# Patient Record
Sex: Male | Born: 1980 | Race: White | Hispanic: No | Marital: Married | Smoking: Current every day smoker
Health system: Southern US, Community
[De-identification: ages and names within clinical notes are randomized; demographics above are authoritative.]

## PROBLEM LIST (undated history)

## (undated) DIAGNOSIS — I4891 Unspecified atrial fibrillation: Secondary | ICD-10-CM

## (undated) DIAGNOSIS — G5760 Lesion of plantar nerve, unspecified lower limb: Secondary | ICD-10-CM

## (undated) HISTORY — PX: ANKLE ARTHROSCOPY: SUR85

## (undated) HISTORY — DX: Lesion of plantar nerve, unspecified lower limb: G57.60

---

## 2002-04-11 ENCOUNTER — Ambulatory Visit: Admission: RE | Admit: 2002-04-11 | Discharge: 2002-04-11 | Payer: Self-pay | Admitting: Orthopedic Surgery

## 2002-04-11 ENCOUNTER — Encounter: Payer: Self-pay | Admitting: Orthopedic Surgery

## 2002-08-27 ENCOUNTER — Emergency Department (HOSPITAL_COMMUNITY): Admission: AC | Admit: 2002-08-27 | Discharge: 2002-08-27 | Payer: Self-pay

## 2002-08-27 ENCOUNTER — Encounter: Payer: Self-pay | Admitting: *Deleted

## 2005-04-11 ENCOUNTER — Ambulatory Visit (HOSPITAL_COMMUNITY): Admission: RE | Admit: 2005-04-11 | Discharge: 2005-04-11 | Payer: Self-pay | Admitting: Orthopedic Surgery

## 2007-02-12 ENCOUNTER — Emergency Department (HOSPITAL_COMMUNITY): Admission: EM | Admit: 2007-02-12 | Discharge: 2007-02-12 | Payer: Self-pay | Admitting: Emergency Medicine

## 2011-01-14 LAB — CBC
HCT: 45.6
Hemoglobin: 16
MCHC: 35.1
MCV: 80.5
Platelets: 159
RBC: 5.67
RDW: 13.5
WBC: 7.5

## 2011-01-14 LAB — URINALYSIS, ROUTINE W REFLEX MICROSCOPIC
Glucose, UA: NEGATIVE
Hgb urine dipstick: NEGATIVE
Nitrite: NEGATIVE
Protein, ur: NEGATIVE
Specific Gravity, Urine: 1.04 — ABNORMAL HIGH
Urobilinogen, UA: 0.2
pH: 5.5

## 2011-01-14 LAB — COMPREHENSIVE METABOLIC PANEL
ALT: 28
AST: 24
Albumin: 4.2
Alkaline Phosphatase: 71
Chloride: 103
GFR calc Af Amer: >60
Potassium: 3.8
Total Bilirubin: 1.1

## 2011-01-14 LAB — DIFFERENTIAL
Basophils Absolute: 0
Eosinophils Absolute: 0
Lymphs Abs: 0.3 — ABNORMAL LOW
Neutro Abs: 6.8

## 2012-12-22 ENCOUNTER — Encounter: Payer: Self-pay | Admitting: *Deleted

## 2012-12-22 DIAGNOSIS — G5760 Lesion of plantar nerve, unspecified lower limb: Secondary | ICD-10-CM | POA: Insufficient documentation

## 2013-01-17 ENCOUNTER — Encounter: Payer: Self-pay | Admitting: Podiatry

## 2013-03-29 ENCOUNTER — Other Ambulatory Visit: Payer: Self-pay | Admitting: *Deleted

## 2013-04-11 ENCOUNTER — Encounter: Payer: Self-pay | Admitting: Podiatry

## 2013-04-14 ENCOUNTER — Ambulatory Visit: Payer: BC Managed Care – PPO | Admitting: Podiatry

## 2013-04-26 ENCOUNTER — Telehealth: Payer: Self-pay | Admitting: *Deleted

## 2013-04-26 NOTE — Telephone Encounter (Signed)
Left message stating I would be happy to help him reschedule his surgery.

## 2013-04-27 ENCOUNTER — Telehealth: Payer: Self-pay | Admitting: *Deleted

## 2013-04-27 NOTE — Telephone Encounter (Signed)
Pt request reschedule 05/03/2013.  I rescheduled pt to 05/17/2013.

## 2013-05-17 ENCOUNTER — Encounter: Payer: Self-pay | Admitting: Podiatry

## 2013-05-17 DIAGNOSIS — G5760 Lesion of plantar nerve, unspecified lower limb: Secondary | ICD-10-CM

## 2013-05-18 ENCOUNTER — Telehealth: Payer: Self-pay | Admitting: *Deleted

## 2013-05-18 NOTE — Telephone Encounter (Addendum)
Pt states has information from his insurance to fax confirmation he had surgery on 05/17/2013.  Pt's voicemail did not accept messages.  I called pt and he stated the confirmation had been sent by a receptionist in our office.

## 2013-05-19 ENCOUNTER — Encounter: Payer: Self-pay | Admitting: Podiatry

## 2013-05-23 ENCOUNTER — Ambulatory Visit (INDEPENDENT_AMBULATORY_CARE_PROVIDER_SITE_OTHER): Payer: BC Managed Care – PPO | Admitting: Podiatry

## 2013-05-23 VITALS — BP 143/73 | HR 53 | Resp 16 | Ht 73.0 in | Wt 205.0 lb

## 2013-05-23 DIAGNOSIS — G5760 Lesion of plantar nerve, unspecified lower limb: Secondary | ICD-10-CM

## 2013-05-23 NOTE — Progress Notes (Signed)
Subjective:     Patient ID: David Swanson, male   DOB: November 25, 1980, 33 y.o.   MRN: [id number]  HPI patient states I'm doing fine on my left foot and able to bear weight but it sore. 6 days after foot surgery   Review of Systems     Objective:   Physical Exam Neurovascular status intact with negative Homans sign noted and well-healing surgical site third interspace of the left foot    Assessment:     Neuroma surgery left it's healing very well    Plan:     Applied compression advised on continued immobilization for the next several weeks with gradual increase in activity. Reappoint any issues should occur if not we'll see back in next 6 weeks

## 2013-05-23 NOTE — Progress Notes (Signed)
Pt states doing well.  Pt's left foot had no swelling or redness, or drainage.

## 2013-05-24 NOTE — Progress Notes (Signed)
1) Neurectomy 3rd interspace left foot

## 2013-05-27 ENCOUNTER — Telehealth: Payer: Self-pay | Admitting: *Deleted

## 2013-05-27 NOTE — Telephone Encounter (Signed)
Pt states having the same nerve pain he had before the surgery.  I asked pt what he has been doing different that he did not do right after post-op.  Pt states he's walking without the post-op shoe, and up on it a lot more.  I told pt to go back into the post-op shoe, decrease his activity and ice periodically, and call for appt if red, swelling.  Pt denies any redness or swelling, states it looks better everyday.

## 2013-06-10 ENCOUNTER — Encounter: Payer: Self-pay | Admitting: Podiatry

## 2013-06-15 ENCOUNTER — Ambulatory Visit (INDEPENDENT_AMBULATORY_CARE_PROVIDER_SITE_OTHER): Payer: BC Managed Care – PPO | Admitting: Podiatry

## 2013-06-15 ENCOUNTER — Encounter: Payer: Self-pay | Admitting: Podiatry

## 2013-06-15 ENCOUNTER — Ambulatory Visit (INDEPENDENT_AMBULATORY_CARE_PROVIDER_SITE_OTHER): Payer: BC Managed Care – PPO

## 2013-06-15 VITALS — BP 152/77 | HR 64 | Resp 12

## 2013-06-15 DIAGNOSIS — M79606 Pain in leg, unspecified: Secondary | ICD-10-CM

## 2013-06-15 DIAGNOSIS — M79609 Pain in unspecified limb: Secondary | ICD-10-CM

## 2013-06-15 DIAGNOSIS — M779 Enthesopathy, unspecified: Secondary | ICD-10-CM

## 2013-06-15 MED ORDER — DICLOFENAC SODIUM 75 MG PO TBEC: 75.0000 mg | DELAYED_RELEASE_TABLET | Freq: Two times a day (BID) | ORAL | Status: DC

## 2013-06-15 NOTE — Progress Notes (Signed)
Subjective:     Patient ID: David AluJoshua D Bara, male   DOB: [birth year], 33 y.o.   MRN: [id number]  HPI patient presents stating I been getting pain in my forefoot left with some swelling and I was concerned about this   Review of Systems     Objective:   Physical Exam Neurovascular status intact negative Homans sign was noted and patient is well oriented x3. Patient has mild edema and discomfort around the lesser MPJs of the left foot    Assessment:     Probable inflammatory capsulitis of the MPJs left foot secondary to surgery with incision site itself healing very well with no erythema edema drainage and wound edges well coapted    Plan:     Instructed on compression elevation and today placed on Voltaren 75 mg twice a day. Reappoint as needed

## 2013-09-22 ENCOUNTER — Encounter: Payer: Self-pay | Admitting: Podiatry

## 2013-09-22 ENCOUNTER — Ambulatory Visit (INDEPENDENT_AMBULATORY_CARE_PROVIDER_SITE_OTHER): Payer: BC Managed Care – PPO

## 2013-09-22 ENCOUNTER — Ambulatory Visit (INDEPENDENT_AMBULATORY_CARE_PROVIDER_SITE_OTHER): Payer: BC Managed Care – PPO | Admitting: Podiatry

## 2013-09-22 VITALS — BP 121/66 | HR 60 | Resp 16

## 2013-09-22 DIAGNOSIS — G5760 Lesion of plantar nerve, unspecified lower limb: Secondary | ICD-10-CM

## 2013-09-22 DIAGNOSIS — M779 Enthesopathy, unspecified: Secondary | ICD-10-CM

## 2013-09-22 MED ORDER — TRIAMCINOLONE ACETONIDE 10 MG/ML IJ SUSP: 10.0000 mg | Freq: Once | INTRAMUSCULAR | Status: AC

## 2013-09-23 NOTE — Progress Notes (Signed)
Subjective:     Patient ID: David Swanson, male   DOB: 1980-09-01, 33 y.o.   MRN: [id number]  HPI patient presents stating he feels like maybe the nerve pain is coming back on his left third interspace about 4 months after having neuroma surgery   Review of Systems     Objective:   Physical Exam Neurovascular status is intact with well-healed surgical site third interspace left with mild symptoms when I pressed deep into the tissue but does not appear to be specifically neuroma relayed    Assessment:     Would be very unusual to have this corrected return of neuroma-like symptoms and it may be some form of inflammatory condition    Plan:     Due to steroidal injection into the third interspace 3 mm Kenalog 5 of Xylocaine Marcaine mixture and reviewed x-rays and reappoint again to reevaluate in the next few weeks. It is possible we may need to go back into this area and exploratory fashion

## 2013-10-27 ENCOUNTER — Ambulatory Visit (INDEPENDENT_AMBULATORY_CARE_PROVIDER_SITE_OTHER): Payer: BC Managed Care – PPO | Admitting: Podiatry

## 2013-10-27 ENCOUNTER — Encounter: Payer: Self-pay | Admitting: Podiatry

## 2013-10-27 DIAGNOSIS — G5762 Lesion of plantar nerve, left lower limb: Secondary | ICD-10-CM

## 2013-10-27 DIAGNOSIS — G5760 Lesion of plantar nerve, unspecified lower limb: Secondary | ICD-10-CM

## 2013-10-27 DIAGNOSIS — M779 Enthesopathy, unspecified: Secondary | ICD-10-CM

## 2013-10-27 MED ORDER — DICLOFENAC SODIUM 75 MG PO TBEC: 75.0000 mg | DELAYED_RELEASE_TABLET | Freq: Two times a day (BID) | ORAL | Status: DC

## 2013-10-29 NOTE — Progress Notes (Signed)
Subjective:     Patient ID: David Swanson, male   DOB: 1980-11-01, 33 y.o.   MRN: [id number]  HPI patient states that it's gradually improving but he still feels like there is a lump under his left foot   Review of Systems     Objective:   Physical Exam Neurovascular status intact with the third interspace which seems to be somewhat better but more with discomfort under the second metatarsal and possibly third metatarsal    Assessment:     May be some low-grade inflammatory capsulitis versus any kind of residual neuroma pathology    Plan:     Begin anti-inflammatory at the current time and continue his senior and let's give it 2 months and decide whether cortisone may be appropriate for the underlying joint pathology or if it just gets better on exam

## 2013-12-29 ENCOUNTER — Ambulatory Visit: Payer: BC Managed Care – PPO | Admitting: Podiatry

## 2014-02-06 ENCOUNTER — Ambulatory Visit: Payer: BC Managed Care – PPO | Admitting: Podiatry

## 2014-04-22 ENCOUNTER — Emergency Department (HOSPITAL_COMMUNITY)
Admission: EM | Admit: 2014-04-22 | Discharge: 2014-04-22 | Disposition: A | Discharge status: Home / Self Care | Payer: BLUE CROSS/BLUE SHIELD | Attending: Emergency Medicine | Admitting: Emergency Medicine

## 2014-04-22 ENCOUNTER — Encounter (HOSPITAL_COMMUNITY): Payer: Self-pay | Admitting: *Deleted

## 2014-04-22 DIAGNOSIS — Z720 Tobacco use: Secondary | ICD-10-CM | POA: Diagnosis not present

## 2014-04-22 DIAGNOSIS — Y288XXA Contact with other sharp object, undetermined intent, initial encounter: Secondary | ICD-10-CM | POA: Diagnosis not present

## 2014-04-22 DIAGNOSIS — Z23 Encounter for immunization: Secondary | ICD-10-CM | POA: Diagnosis not present

## 2014-04-22 DIAGNOSIS — Y998 Other external cause status: Secondary | ICD-10-CM | POA: Diagnosis not present

## 2014-04-22 DIAGNOSIS — Y9289 Other specified places as the place of occurrence of the external cause: Secondary | ICD-10-CM | POA: Insufficient documentation

## 2014-04-22 DIAGNOSIS — Y9389 Activity, other specified: Secondary | ICD-10-CM | POA: Diagnosis not present

## 2014-04-22 DIAGNOSIS — S61511A Laceration without foreign body of right wrist, initial encounter: Secondary | ICD-10-CM | POA: Diagnosis present

## 2014-04-22 DIAGNOSIS — Z791 Long term (current) use of non-steroidal anti-inflammatories (NSAID): Secondary | ICD-10-CM | POA: Insufficient documentation

## 2014-04-22 MED ORDER — LIDOCAINE-EPINEPHRINE-TETRACAINE (LET) SOLUTION
NASAL | Status: AC
  Filled 2014-04-22: qty 3

## 2014-04-22 MED ORDER — LIDOCAINE-EPINEPHRINE-TETRACAINE (LET) SOLUTION
3.0000 mL | Freq: Once | NASAL | Status: AC
  Administered 2014-04-22: 3 mL via TOPICAL

## 2014-04-22 MED ORDER — TETANUS-DIPHTH-ACELL PERTUSSIS 5-2.5-18.5 LF-MCG/0.5 IM SUSP
0.5000 mL | Freq: Once | INTRAMUSCULAR | Status: AC
  Administered 2014-04-22: 0.5 mL via INTRAMUSCULAR

## 2014-04-22 MED ORDER — POVIDONE-IODINE 10 % EX SOLN
CUTANEOUS | Status: AC
  Administered 2014-04-22: 19:00:00
  Filled 2014-04-22: qty 118

## 2014-04-22 MED ORDER — TETANUS-DIPHTH-ACELL PERTUSSIS 5-2.5-18.5 LF-MCG/0.5 IM SUSP
INTRAMUSCULAR | Status: AC
  Filled 2014-04-22: qty 0.5

## 2014-04-22 MED ORDER — LIDOCAINE HCL (PF) 1 % IJ SOLN
INTRAMUSCULAR | Status: AC
  Administered 2014-04-22: 19:00:00
  Filled 2014-04-22: qty 5

## 2014-04-22 NOTE — ED Notes (Signed)
Laceration to right wrist

## 2014-04-22 NOTE — ED Provider Notes (Signed)
CSN: [id number]     Arrival date & time 04/22/14  1701 History   First MD Initiated Contact with Patient 04/22/14 1749     Chief Complaint  Patient presents with  . Extremity Laceration     (Consider location/radiation/quality/duration/timing/severity/associated sxs/prior Treatment) Patient is a 34 y.o. male presenting with skin laceration. The history is provided by the patient.  Laceration Location:  Hand Hand laceration location:  R wrist Length (cm):  2 cm Depth:  Through underlying tissue Quality: straight   Bleeding: controlled   Time since incident:  1 hour Laceration mechanism:  Razor Pain details:    Quality:  Burning   Severity:  Mild   Timing:  Constant Foreign body present:  No foreign bodies Relieved by:  Pressure Worsened by:  Nothing tried Ineffective treatments:  None tried Tetanus status:  Out of date  David Swanson is a 34 y.o. male who presents to the ED with a laceration to the right wrist. He states he was using a razor knife and it slipped and cut him. He denies any other injuries or problems.  Past Medical History  Diagnosis Date  . 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 Ankle arthroscopy Bilateral UNKNOWN    TYPE OF SURGERY B/L UNKNOWN   Family History  Problem Relation Age of Onset  . Cancer Mother    History  Substance Use Topics  . Smoking status: Current Every Day Smoker -- 0.25 packs/day for 5 years    Start date: 12/23/2007  . Smokeless tobacco: Not on file  . Alcohol Use: No    Review of Systems Negative except as stated in HPI   Allergies  Review of patient's allergies indicates no known allergies.  Home Medications   Prior to Admission medications   Medication Sig Start Date End Date Taking? Authorizing Provider  diclofenac (VOLTAREN) 75 MG EC tablet Take 1 tablet (75 mg total) by mouth 2 (two) times daily. 10/27/13   Kirstie Peri Regal, DPM   BP 136/79 mmHg   Pulse 54  Temp(Src) 98.4 F (36.9 C) (Oral)  Resp 18  Ht  (1.88 m)  Wt 190 lb (86.183 kg)  BMI 24.38 kg/m2  SpO2 99% Physical Exam  Constitutional: He is oriented to person, place, and time. He appears well-developed and well-nourished.  HENT:  Head: Normocephalic.  Eyes: EOM are normal.  Neck: Neck supple.  Cardiovascular: Normal rate.   Pulmonary/Chest: Effort normal.  Musculoskeletal: Normal range of motion.       Right wrist: He exhibits tenderness and laceration. He exhibits normal range of motion, no swelling and no deformity.  Normal strength and sensation.   Neurological: He is alert and oriented to person, place, and time. No cranial nerve deficit.  Skin: Skin is warm and dry.  Psychiatric: He has a normal mood and affect. His behavior is normal.  Nursing note and vitals reviewed.   ED Course  Procedures  LACERATION REPAIR Performed by: NEESE,HOPE Authorized by: NEESE,HOPE Consent: Verbal consent obtained. Risks and benefits: risks, benefits and alternatives were discussed Consent given by: patient Patient identity confirmed: provided demographic data Prepped and Draped in normal sterile fashion Wound explored not foreign body identified LET applied  Laceration Location: right wrist  Laceration Length: 2 cm  No Foreign Bodies seen or palpated  Anesthesia: local infiltration  Local anesthetic: lidocaine 1% without epinephrine  Anesthetic total: 1 ml  Irrigation method:  syringe Amount of cleaning: standard  Skin closure: 4-0 prolene  Number of sutures: 4  Technique: interrupted  Patient tolerance: Patient tolerated the procedure well with no immediate complications.  MDM  34 y.o. male with laceration to the right wrist. Stable for d/c without neurovascular deficits.   Tetanus updated Final diagnoses:  Laceration of right wrist without complication, initial encounter     [facility] Neese, NP 04/22/14 1830  Gerhard Munchobert Lockwood, MD 04/22/14  2101

## 2014-04-22 NOTE — ED Notes (Signed)
Medications given by Mayer CamelH. Neese, EDNP.

## 2014-07-03 ENCOUNTER — Ambulatory Visit (INDEPENDENT_AMBULATORY_CARE_PROVIDER_SITE_OTHER): Payer: BLUE CROSS/BLUE SHIELD

## 2014-07-03 ENCOUNTER — Ambulatory Visit (INDEPENDENT_AMBULATORY_CARE_PROVIDER_SITE_OTHER): Payer: BLUE CROSS/BLUE SHIELD | Admitting: Podiatry

## 2014-07-03 VITALS — BP 122/78 | HR 63 | Resp 16

## 2014-07-03 DIAGNOSIS — M79672 Pain in left foot: Secondary | ICD-10-CM

## 2014-07-03 DIAGNOSIS — G5762 Lesion of plantar nerve, left lower limb: Secondary | ICD-10-CM

## 2014-07-03 NOTE — Patient Instructions (Signed)
Pre-Operative Instructions  Congratulations, you have decided to take an important step to improving your quality of life.  You can be assured that the doctors of [facility] will be with you every step of the way.  1. Plan to be at the surgery center/hospital at least 1 (one) hour prior to your scheduled time unless otherwise directed by the surgical center/hospital staff.  You must have a responsible adult accompany you, remain during the surgery and drive you home.  Make sure you have directions to the surgical center/hospital and know how to get there on time. 2. For hospital based surgery you will need to obtain a history and physical form from your family physician within 1 month prior to the date of surgery- we will give you a form for you primary physician.  3. We make every effort to accommodate the date you request for surgery.  There are however, times where surgery dates or times have to be moved.  We will contact you as soon as possible if a change in schedule is required.   4. No Aspirin/Ibuprofen for one week before surgery.  If you are on aspirin, any non-steroidal anti-inflammatory medications (Mobic, Aleve, Ibuprofen) you should stop taking it 7 days prior to your surgery.  You make take Tylenol  For pain prior to surgery.  5. Medications- If you are taking daily heart and blood pressure medications, seizure, reflux, allergy, asthma, anxiety, pain or diabetes medications, make sure the surgery center/hospital is aware before the day of surgery so they may notify you which medications to take or avoid the day of surgery. 6. No food or drink after midnight the night before surgery unless directed otherwise by surgical center/hospital staff. 7. No alcoholic beverages 24 hours prior to surgery.  No smoking 24 hours prior to or 24 hours after surgery. 8. Wear loose pants or shorts- loose enough to fit over bandages, boots, and casts. 9. No slip on shoes, sneakers are best. 10. Bring  your boot with you to the surgery center/hospital.  Also bring crutches or a walker if your physician has prescribed it for you.  If you do not have this equipment, it will be provided for you after surgery. 11. If you have not been contracted by the surgery center/hospital by the day before your surgery, call to confirm the date and time of your surgery. 12. Leave-time from work may vary depending on the type of surgery you have.  Appropriate arrangements should be made prior to surgery with your employer. 13. Prescriptions will be provided immediately following surgery by your doctor.  Have these filled as soon as possible after surgery and take the medication as directed. 14. Remove nail polish on the operative foot. 15. Wash the night before surgery.  The night before surgery wash the foot and leg well with the antibacterial soap provided and water paying special attention to beneath the toenails and in between the toes.  Rinse thoroughly with water and dry well with a towel.  Perform this wash unless told not to do so by your physician.  Enclosed: 1 Ice pack (please put in freezer the night before surgery)   1 Hibiclens skin cleaner   Pre-op Instructions  If you have any questions regarding the instructions, do not hesitate to call our office.  [place]: [address] [place], [place] [address] [phone]  [place]: [address] [place], [place] [address] [phone]  [place]: [address]  , [place] [address] [phone]  Dr. Richard   Tuchman DPM, Dr. Norman Regal DPM Dr. Richard Sikora DPM, Dr. M. Todd Hyatt DPM, Dr. Kathryn Egerton DPM 

## 2014-07-04 NOTE — Progress Notes (Signed)
Subjective:     Patient ID: David Swanson, male   DOB: Dec 26, 1980, 34 y.o.   MRN: [id number]  HPI patient states I'm still having a lot of problems with this left foot. States that it seemed to improve and now does bother me again and I'm feeling sharp burning pain but it's more under my foot that on top. I do feel numbness between my third and fourth toes   Review of Systems     Objective:   Physical Exam Neurovascular status intact with muscle strength adequate and range of motion subtalar and midtarsal joint within normal limits. Patient's noted to have pain that palpable on the plantar aspect of the surface of the third interspace and slightly around the third metatarsal. It creates an area of sharp pain but what I did the typical Mulder's test I was unable to elicit discomfort    Assessment:     Probability for some kind of a aberrant neuroma or nerve on the plantar aspect of the foot that is creating a lot of pain with reduction of symptoms secondary to traditional neuroma excision    Plan:     Explained this all to patient at this point I recommended a plantar approach with exploration of the area and probable removal of nerve tissue with the chance that there is no guarantee that this will get this better. He understands all this wants surgery and at this time I allowed him to read a consent form reviewing all risk associated with procedure and alternative treatments. He wants surgery and signs consent form and I did dispense air fracture walker with instructions on usage in order to keep pressure off the plantar incision postoperatively and that total recovery. We'll hopefully take 6-8 weeks and may have some discomfort for 6 months to one year

## 2014-07-17 ENCOUNTER — Telehealth: Payer: Self-pay | Admitting: *Deleted

## 2014-07-17 NOTE — Telephone Encounter (Signed)
I called patient to inquire about insurance.  Trying to check benefits for surgery.  [facility]."  Okay, thank you.

## 2014-07-21 ENCOUNTER — Telehealth: Payer: Self-pay | Admitting: *Deleted

## 2014-07-21 NOTE — Telephone Encounter (Signed)
Patients new post-op appointment is Monday 08/14/2014 @ 3:45pm.

## 2014-07-21 NOTE — Telephone Encounter (Signed)
"  I need to postpone my surgery.  It's scheduled for next week with Dr. Charlsie Merlesegal.  I have a project on my job that I have to complete."  I can get you rescheduled to 08/08/2014.  "That will be fine.  Will it be the same time?"  No, surgical center will call you with arrival time a few days prior to date.  "Okay, thanks."  I called and left Renee a message to reschedule his surgery from 07/25/2014 to 08/08/2014.  I sent Aram BeechamCynthia a fax.

## 2014-07-31 ENCOUNTER — Other Ambulatory Visit: Payer: BLUE CROSS/BLUE SHIELD

## 2014-08-08 ENCOUNTER — Encounter: Payer: Self-pay | Admitting: Podiatry

## 2014-08-08 DIAGNOSIS — G5760 Lesion of plantar nerve, unspecified lower limb: Secondary | ICD-10-CM | POA: Diagnosis not present

## 2014-08-14 ENCOUNTER — Encounter: Payer: Self-pay | Admitting: Podiatry

## 2014-08-14 ENCOUNTER — Ambulatory Visit (INDEPENDENT_AMBULATORY_CARE_PROVIDER_SITE_OTHER): Payer: BLUE CROSS/BLUE SHIELD | Admitting: Podiatry

## 2014-08-14 VITALS — BP 123/73 | HR 67 | Resp 18

## 2014-08-14 DIAGNOSIS — G5762 Lesion of plantar nerve, left lower limb: Secondary | ICD-10-CM

## 2014-08-14 DIAGNOSIS — Z9889 Other specified postprocedural states: Secondary | ICD-10-CM

## 2014-08-14 NOTE — Patient Instructions (Signed)
Monitor for any signs/symptoms of infection. Call the office immediately if any occur or go directly to the emergency room. Call with any questions/concerns.  

## 2014-08-15 ENCOUNTER — Encounter: Payer: Self-pay | Admitting: Podiatry

## 2014-08-15 NOTE — Progress Notes (Signed)
Patient ID: David Swanson, male   DOB: 07-18-1980, 34 y.o.   MRN: [id number]  Subjective: 34 year old male presents the office today one-week status post left foot third interspace revisional neurectomy. He states that overall he is doing well and he has had no pain to the surgical site. Has continued wearing the CAM walker without any difficulties.  He denies any systemic complaints such as fevers, chills, nausea, vomiting. Denies any calf pain, chest pain, soreness of breath. No other complaints at this time in no acute changes since last appointment.  Objective: AAO x3, NAD DP/PT pulses palpable bilaterally, CRT less than 3 seconds Protective sensation intact with Simms Weinstein monofilament  Incisional the plantar aspect of the third interspaces well coapted. Upon my evaluation the sutures have been removed. The incision remains well coapted. There is no  Surrounding erythema, ascending cellulitis, drainage, malodor, or any other clinical signs of infection. There is trace edema around the surgical site. There is no tenderness to palpation overlying the area. No other areas of tenderness to bilateral lower extremities. No open lesions or pre-ulcerative lesions.  No overlying edema, erythema, increase in warmth to bilateral lower extremities.  No pain with calf compression, swelling, warmth, erythema bilaterally.   Assessment:  34 year old male 1 week status post left third interspace neurectomy,  revisional   Plan: -Treatment options were discussed the patient with alternatives, risks, complications. -Upon my evaluation the sutures had already been removed. Therefore, Steri-Strips were applied followed by antibiotic ointment and a bandage. I discussed the patient that if the incision remains coapted there is no prominence of the incision he can start to shower in 24-48 hours. If there is any problems with the incision to hold off on showering and call the office immediately. -Continue with  Cam Walker. -Ice and elevation -Pain medication as needed -Monitoring clinical signs or symptoms of infection and her DVT/PE and directed to call the office immediately should any occur or go to the ER. -Follow-up in 1 week or sooner if any problems are to arise. In the meantime call the office with any questions, concerns, change in symptoms.

## 2014-08-15 NOTE — Progress Notes (Signed)
DOS 08/08/2014 excision soft tissue mass, plantar approach left foot

## 2014-08-21 ENCOUNTER — Encounter: Payer: Self-pay | Admitting: Podiatry

## 2014-08-21 ENCOUNTER — Ambulatory Visit (INDEPENDENT_AMBULATORY_CARE_PROVIDER_SITE_OTHER): Payer: BLUE CROSS/BLUE SHIELD | Admitting: Podiatry

## 2014-08-21 VITALS — BP 114/77 | HR 85 | Resp 18

## 2014-08-21 DIAGNOSIS — Z9889 Other specified postprocedural states: Secondary | ICD-10-CM

## 2014-08-22 NOTE — Progress Notes (Signed)
Subjective:     Patient ID: David AluJoshua D Swanson, male   DOB: 1981/02/03, 34 y.o.   MRN: [id number]  HPI patient presents stating it feels fine and I'm walking good   Review of Systems     Objective:   Physical Exam Neurovascular status intact with well healed surgical site plantar left third interspace with wound edges that are healing well with no gapping at the current time. He states that it feels better when palpated    Assessment:     Doing well post plantar neurectomy third interspace left    Plan:     Applied Steri-Strips to the area and compression and advised on wearing his boot for approximately one more week. Patient will reappoint if any Sherral HammersRobbins should occur

## 2014-10-05 ENCOUNTER — Encounter: Payer: Self-pay | Admitting: Podiatry

## 2014-10-05 ENCOUNTER — Ambulatory Visit (INDEPENDENT_AMBULATORY_CARE_PROVIDER_SITE_OTHER): Payer: BLUE CROSS/BLUE SHIELD | Admitting: Podiatry

## 2014-10-05 DIAGNOSIS — B079 Viral wart, unspecified: Secondary | ICD-10-CM | POA: Diagnosis not present

## 2014-10-05 DIAGNOSIS — B078 Other viral warts: Secondary | ICD-10-CM

## 2014-10-05 DIAGNOSIS — G5762 Lesion of plantar nerve, left lower limb: Secondary | ICD-10-CM

## 2014-10-05 NOTE — Progress Notes (Signed)
Subjective:     Patient ID: David AluJoshua D Swanson, male   DOB: 27-Jan-1981, 34 y.o.   MRN: [id number]  HPI patient states I'm doing well on my left foot but have one area of keratotic tissue and also I like to wait a little longer to go back to work   Review of Systems     Objective:   Physical Exam Her vascular status intact with distal keratotic lesion on the plantar incision site localized in nature with no proximal medial or lateral spread    Assessment:     Lesion secondary to pressure with possibility for verruca component    Plan:     Debrided the lesion noted a small amount of pinpoint bleeding and applied chemical agent to try to kill any virus cell and applied sterile dressing

## 2014-10-20 ENCOUNTER — Emergency Department (INDEPENDENT_AMBULATORY_CARE_PROVIDER_SITE_OTHER)
Admission: EM | Admit: 2014-10-20 | Discharge: 2014-10-20 | Disposition: A | Discharge status: Home / Self Care | Payer: BLUE CROSS/BLUE SHIELD | Source: Home / Self Care | Attending: Family Medicine | Admitting: Family Medicine

## 2014-10-20 ENCOUNTER — Encounter (HOSPITAL_COMMUNITY): Payer: Self-pay | Admitting: Emergency Medicine

## 2014-10-20 DIAGNOSIS — J029 Acute pharyngitis, unspecified: Secondary | ICD-10-CM | POA: Diagnosis not present

## 2014-10-20 LAB — POCT RAPID STREP A: STREPTOCOCCUS, GROUP A SCREEN (DIRECT): NEGATIVE

## 2014-10-20 MED ORDER — AMOXICILLIN 500 MG PO CAPS: 500.0000 mg | ORAL_CAPSULE | Freq: Two times a day (BID) | ORAL | Status: DC

## 2014-10-20 NOTE — ED Provider Notes (Signed)
CSN: [id number]     Arrival date & time 10/20/14  1407 History   First MD Initiated Contact with Patient 10/20/14 1421     Chief Complaint  Patient presents with  . Sore Throat   (Consider location/radiation/quality/duration/timing/severity/associated sxs/prior Treatment) HPI  Sore throat. Started 1 day ago. Associated with body aches, fevers, chills, difficulty swallowing. Patient is not taken any medications for this. Symptoms are constant and getting worse. Denies abdominal pain, neck stiffness, chest pain, shortness breath, palpitations. Of note patient with diagnosis of flu and subsequent pneumonia back in March.    Past Medical History  Diagnosis Date  . Gwenevere AbbotMorton neuroma [id number]    LEFT 3 RD INTERSPACE   Past Surgical History  Procedure Laterality Date  . Ankle arthroscopy Bilateral UNKNOWN    TYPE OF SURGERY B/L UNKNOWN   Family History  Problem Relation Age of Onset  . Cancer Mother    History  Substance Use Topics  . Smoking status: Current Every Day Smoker -- 0.25 packs/day for 5 years    Start date: 12/23/2007  . Smokeless tobacco: Not on file  . Alcohol Use: No    Review of Systems Per HPI with all other pertinent systems negative.   Allergies  Review of patient's allergies indicates no known allergies.  Home Medications   Prior to Admission medications   Medication Sig Start Date End Date Taking? Authorizing Provider  amoxicillin (AMOXIL) 500 MG capsule Take 1 capsule (500 mg total) by mouth 2 (two) times daily. 10/20/14   Ozella Rocksavid J Merrell, MD  diclofenac (VOLTAREN) 75 MG EC tablet Take 1 tablet (75 mg total) by mouth 2 (two) times daily. 10/27/13   Kirstie PeriNorman S Regal, DPM   BP 127/75 mmHg  Pulse 74  Temp(Src) 101.5 F (38.6 C) (Oral)  Resp 16  SpO2 95% Physical Exam Physical Exam  Constitutional: oriented to person, place, and time. appears well-developed and well-nourished. No distress.  HENT:  Tonsils 1+ with exudate and very erythematous. Head:  Normocephalic and atraumatic.  Eyes: EOMI. PERRL.  Neck: Normal range of motion.  Cardiovascular: RRR, no m/r/g, 2+ distal pulses,  Pulmonary/Chest: Effort normal and breath sounds normal. No respiratory distress.  Abdominal: Soft. Bowel sounds are normal. NonTTP, no distension.  Musculoskeletal: Normal range of motion. Non ttp, no effusion.  Neurological: alert and oriented to person, place, and time.  Skin: Skin is warm. No rash noted. non diaphoretic.  Psychiatric: normal mood and affect. behavior is normal. Judgment and thought content normal.   ED Course  Procedures (including critical care time) Labs Review Labs Reviewed  POCT RAPID STREP A    Imaging Review No results found.   MDM   1. Sore throat    Strep negative. We'll send strep culture. Start amoxicillin.    Ozella Rocksavid J Merrell, MD 10/20/14 [phone]

## 2014-10-20 NOTE — ED Notes (Signed)
C/o sore throat which started yesterday  States he has a fever, body aches No tx

## 2014-10-20 NOTE — Discharge Instructions (Signed)
You likely have strep throat. Please start the amoxicillin and take it for the full 10 days.

## 2014-10-22 LAB — CULTURE, GROUP A STREP: STREP A CULTURE: NEGATIVE

## 2015-12-22 ENCOUNTER — Encounter (HOSPITAL_BASED_OUTPATIENT_CLINIC_OR_DEPARTMENT_OTHER): Payer: Self-pay | Admitting: *Deleted

## 2015-12-22 ENCOUNTER — Emergency Department (HOSPITAL_BASED_OUTPATIENT_CLINIC_OR_DEPARTMENT_OTHER)
Admission: EM | Admit: 2015-12-22 | Discharge: 2015-12-22 | Disposition: A | Discharge status: Home / Self Care | Payer: BLUE CROSS/BLUE SHIELD | Attending: Emergency Medicine | Admitting: Emergency Medicine

## 2015-12-22 DIAGNOSIS — F17200 Nicotine dependence, unspecified, uncomplicated: Secondary | ICD-10-CM | POA: Insufficient documentation

## 2015-12-22 DIAGNOSIS — I4891 Unspecified atrial fibrillation: Secondary | ICD-10-CM | POA: Insufficient documentation

## 2015-12-22 DIAGNOSIS — Z7982 Long term (current) use of aspirin: Secondary | ICD-10-CM | POA: Diagnosis not present

## 2015-12-22 DIAGNOSIS — R002 Palpitations: Secondary | ICD-10-CM | POA: Diagnosis present

## 2015-12-22 LAB — COMPREHENSIVE METABOLIC PANEL
ALBUMIN: 4.6 g/dL (ref 3.5–5.0)
ALK PHOS: 45 U/L (ref 38–126)
ALT: 30 U/L (ref 17–63)
ANION GAP: 9 (ref 5–15)
AST: 26 U/L (ref 15–41)
BUN: 15 mg/dL (ref 6–20)
CALCIUM: 9.9 mg/dL (ref 8.9–10.3)
CHLORIDE: 104 mmol/L (ref 101–111)
CO2: 23 mmol/L (ref 22–32)
Creatinine, Ser: 0.91 mg/dL (ref 0.61–1.24)
GFR calc non Af Amer: >60 mL/min (ref >60)
GLUCOSE: 147 mg/dL — AB (ref 65–99)
Potassium: 4.1 mmol/L (ref 3.5–5.1)
SODIUM: 136 mmol/L (ref 135–145)
Total Bilirubin: 1.2 mg/dL (ref 0.3–1.2)
Total Protein: 7.5 g/dL (ref 6.5–8.1)

## 2015-12-22 LAB — CBC WITH DIFFERENTIAL/PLATELET
BASOS ABS: 0 10*3/uL (ref 0.0–0.1)
BASOS PCT: 0 %
EOS ABS: 0.1 10*3/uL (ref 0.0–0.7)
Eosinophils Relative: 1 %
HEMATOCRIT: 49.2 % (ref 39.0–52.0)
HEMOGLOBIN: 17.7 g/dL — AB (ref 13.0–17.0)
LYMPHS ABS: 2.6 10*3/uL (ref 0.7–4.0)
LYMPHS PCT: 24 %
MCH: 28.9 pg (ref 26.0–34.0)
MCHC: 36 g/dL (ref 30.0–36.0)
MCV: 80.3 fL (ref 78.0–100.0)
Monocytes Absolute: 1.4 10*3/uL — ABNORMAL HIGH (ref 0.1–1.0)
Monocytes Relative: 13 %
NEUTROS ABS: 6.7 10*3/uL (ref 1.7–7.7)
NEUTROS PCT: 62 %
Platelets: 210 10*3/uL (ref 150–400)
RBC: 6.13 MIL/uL — ABNORMAL HIGH (ref 4.22–5.81)
RDW: 14.3 % (ref 11.5–15.5)
WBC: 10.8 10*3/uL — AB (ref 4.0–10.5)

## 2015-12-22 LAB — D-DIMER, QUANTITATIVE: D-Dimer, Quant: <0.27 ug/mL-FEU (ref 0.00–0.50)

## 2015-12-22 LAB — TROPONIN I

## 2015-12-22 MED ORDER — ADENOSINE 6 MG/2ML IV SOLN
6.0000 mg | Freq: Once | INTRAVENOUS | Status: AC
  Administered 2015-12-22: 12 mg via INTRAVENOUS

## 2015-12-22 MED ORDER — SODIUM CHLORIDE 0.9 % IV BOLUS (SEPSIS)
1000.0000 mL | Freq: Once | INTRAVENOUS | Status: AC
  Administered 2015-12-22: 1000 mL via INTRAVENOUS

## 2015-12-22 MED ORDER — ETOMIDATE 2 MG/ML IV SOLN
0.1000 mg/kg | Freq: Once | INTRAVENOUS | Status: AC
  Administered 2015-12-22: 8.4 mg via INTRAVENOUS
  Filled 2015-12-22: qty 10

## 2015-12-22 MED ORDER — ADENOSINE 6 MG/2ML IV SOLN
12.0000 mg | Freq: Once | INTRAVENOUS | Status: AC
  Administered 2015-12-22: 12 mg via INTRAVENOUS

## 2015-12-22 MED ORDER — ADENOSINE 6 MG/2ML IV SOLN
INTRAVENOUS | Status: AC
  Filled 2015-12-22: qty 4

## 2015-12-22 MED ORDER — ETOMIDATE 2 MG/ML IV SOLN
INTRAVENOUS | Status: AC | PRN
  Administered 2015-12-22: 2.2 mg via INTRAVENOUS
  Administered 2015-12-22: 2.1 mg via INTRAVENOUS
  Administered 2015-12-22: 2.2 mg via INTRAVENOUS

## 2015-12-22 MED ORDER — METOPROLOL TARTRATE 25 MG PO TABS: 12.5000 mg | ORAL_TABLET | Freq: Two times a day (BID) | ORAL | 1 refills | Status: DC

## 2015-12-22 MED ORDER — ASPIRIN 325 MG PO TABS
325.0000 mg | ORAL_TABLET | Freq: Once | ORAL | Status: AC
  Administered 2015-12-22: 325 mg via ORAL
  Filled 2015-12-22: qty 1

## 2015-12-22 MED ORDER — ADENOSINE 6 MG/2ML IV SOLN
INTRAVENOUS | Status: AC
  Administered 2015-12-22: 6 mg
  Filled 2015-12-22: qty 6

## 2015-12-22 MED ORDER — ASPIRIN EC 325 MG PO TBEC: 325.0000 mg | DELAYED_RELEASE_TABLET | Freq: Every day | ORAL | 0 refills | Status: DC

## 2015-12-22 NOTE — ED Triage Notes (Signed)
Pt reports he feels as if his heart is racing/pounding sensation. Also reports associated shortness of breath and lightheadedness. States this began last night. Denies hx of dysrhythmias. Denies fever, n/v/d.

## 2015-12-22 NOTE — ED Notes (Signed)
MD at bedside. 

## 2015-12-22 NOTE — ED Provider Notes (Signed)
[facility] Provider Note   CSN: [id number] Arrival date & time: 12/22/15  1114     History   Chief Complaint Chief Complaint  Patient presents with  . Palpitations       HPI David Swanson is a 35 y.o.male presents for heart palpitations that started yesterday evening at approximately 11 pm He noted that he felt dehydrated throughout the day yesterday. He had been working at [employer] and had not been drinking well. He continued his usual activities throughout the day but noted his heart was beating fast after intercourse with his fiance at approximately 11 pm, and this sensation did not stop. He drank 4 glasses of water overnight as he was very thirsty but had no improvement in his heart rate.  He was able to sleep but when he awoke this AM he still felt his heart was "pounding" so he presented to the ED. He denies chest pain, but does feel he is having shortness of breath. He denies a cardiac history, denies a family history of cardiac disease. He has never had anything like this happen before. He denies drug use, but does smoke 0.5 ppd and drinks one glass of wine per day.  Otherwise he denies recent recent illness, nausea, emesis, diarrhea  Past Medical History:  Diagnosis Date  . 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 Morton neuroma     Past Surgical History:  Procedure Laterality Date  . ANKLE ARTHROSCOPY Bilateral UNKNOWN   TYPE OF SURGERY B/L UNKNOWN       Home Medications    Prior to Admission medications   Medication Sig Start Date End Date Taking? Authorizing Provider  amoxicillin (AMOXIL) 500 MG capsule Take 1 capsule (500 mg total) by mouth 2 (two) times daily. 10/20/14   Ozella Rocks, MD  aspirin EC 325 MG tablet Take 1 tablet (325 mg total) by mouth daily. 12/22/15   Bonney Aid, MD  diclofenac (VOLTAREN) 75 MG EC tablet Take 1 tablet (75 mg total) by mouth 2 (two) times  daily. 10/27/13   Lenn Sink, DPM  metoprolol tartrate (LOPRESSOR) 25 MG tablet Take 0.5 tablets (12.5 mg total) by mouth 2 (two) times daily. 12/22/15   Bonney Aid, MD    Family History Family History  Problem Relation Age of Onset  . Cancer Mother     Social History Social History  Substance Use Topics  . Smoking status: Current Every Day Smoker    Packs/day: 0.25    Years: 5.00    Start date: 12/23/2007  . Smokeless tobacco: Never Used  . Alcohol use 0.6 - 1.2 oz/week    1 - 2 Glasses of wine per week     Comment: daily     Allergies   Review of patient's allergies indicates no known allergies.   Review of Systems Review of Systems   Physical Exam Updated Vital Signs BP 126/81 (BP Location: Right Arm)   Pulse 60   Temp 98.2 F (36.8 C) (Oral)   Resp 18   Ht 6\' 2"  (1.88 m)   Wt 83.9 kg   SpO2 100%   BMI 23.75 kg/m   Physical Exam   ED Treatments / Results  Labs (all labs ordered are listed, but only abnormal results are displayed) Labs Reviewed  CBC WITH DIFFERENTIAL/PLATELET - Abnormal; Notable for the following:  Result Value   WBC 10.8 (*)    RBC 6.13 (*)    Hemoglobin 17.7 (*)    Monocytes Absolute 1.4 (*)    All other components within normal limits  COMPREHENSIVE METABOLIC PANEL - Abnormal; Notable for the following:    Glucose, Bld 147 (*)    All other components within normal limits  TROPONIN I  D-DIMER, QUANTITATIVE (NOT AT [facility])    EKG  EKG Interpretation  Date/Time:  Saturday December 22 2015 11:20:19 EDT Ventricular Rate:  161 PR Interval:    QRS Duration: 86 QT Interval:  284 QTC Calculation: 464 R Axis:   57 Text Interpretation:  Atrial fibrillation with rapid ventricular response Abnormal ECG No old tracing to compare Confirmed by [facility] MD, Barbara Cower [phone]) on 12/22/2015 12:33:49 PM       Radiology No results found.  Procedures Procedures (including critical care time)  Medications Ordered in  ED Medications  adenosine (ADENOCARD) 6 MG/2ML injection (not administered)  adenosine (ADENOCARD) 6 MG/2ML injection (6 mg  Given 12/22/15 1134)  adenosine (ADENOCARD) 6 MG/2ML injection 6 mg (12 mg Intravenous Given 12/22/15 1138)  adenosine (ADENOCARD) 6 MG/2ML injection 12 mg (12 mg Intravenous Given 12/22/15 1146)  etomidate (AMIDATE) injection 8.4 mg (8.4 mg Intravenous Given 12/22/15 1257)  aspirin tablet 325 mg (325 mg Oral Given 12/22/15 1239)  etomidate (AMIDATE) injection (2.1 mg Intravenous Given 12/22/15 1315)  sodium chloride 0.9 % bolus 1,000 mL (0 mLs Intravenous Stopped 12/22/15 1541)  sodium chloride 0.9 % bolus 1,000 mL (0 mLs Intravenous Stopped 12/22/15 1541)     Initial Impression / Assessment and Plan / ED Course  I have reviewed the triage vital signs and the nursing notes.  Pertinent labs & imaging results that were available during my care of the patient were reviewed by me and considered in my medical decision making (see chart for details).  Clinical Course    ED course significant for electrical cardioversion with successful achievement of normal sinus rhythm  Final Clinical Impressions(s) / ED Diagnoses   Final diagnoses:  Atrial fibrillation with RVR (HCC)    35 y/o M with no significant PMH presented with atrial fibrillation with RvR resolved after electrical cardioversion. He had no chest pain and troponin was negative, EKG showed no evidence of ischemia. D dimer was negative, making PE less likely. Initially his EKG was tachycardic to the 160s and appeared to be SVT. He was given adenosine 6 mg with no improvement then 12 mg x2, His heart rate did slow but likely due to the concomitant fluids he was receiving. Once his heart rate slowed it became clear he as in atrial fibrillation. After risks and benefits were discussed he elected to proceed with electrical cardioversion and moderate sedation with etomidate. Dr Sharyn Lull with cardiology was notified and agreed  with the management and recommened starting metoprolol and full dose aspirin. CHADSVASC score 0. After cardioversion x1, he returned to normal sinus rhythm and his heart rate improved to the 60s. He was monitored for several hours post cardioversion and he remained in normal sinus rhythm. He was started on metoprolol 12.5 BID and ASA 325 qD. His atrial fibrillation was likely contributed to by dehydration as his heart rate started to improve with fluid administration. However, he will continue further work up with cardiology.  He was discharged to follow up with Dr. Sharyn Lull as soon as possible. ED return precautions were dicussed and he was discharged from the ED when hemodynamically. All questions were  answered to the patient's satisfaction    New Prescriptions Discharge Medication List as of 12/22/2015  3:15 PM    START taking these medications   Details  aspirin EC 325 MG tablet Take 1 tablet (325 mg total) by mouth daily., Starting Sat 12/22/2015, Print    metoprolol tartrate (LOPRESSOR) 25 MG tablet Take 0.5 tablets (12.5 mg total) by mouth 2 (two) times daily., Starting Sat 12/22/2015, Print        Tessica Cupo A. Kennon RoundsHaney MD, MS Family Medicine Resident PGY-3 Pager [phone]    Bonney AidAlyssa A Tryton Bodi, MD 12/22/15 1610    Bonney AidAlyssa A Sophea Rackham, MD 12/22/15 [id number]    Marily MemosJason Mesner, MD 12/22/15 [id number]

## 2015-12-22 NOTE — ED Notes (Signed)
MD at bedside. 

## 2015-12-22 NOTE — ED Notes (Signed)
[facility]

## 2015-12-22 NOTE — ED Notes (Signed)
Post med admin vitals documented. Rhythm not charted. See vitals.

## 2015-12-22 NOTE — Sedation Documentation (Signed)
Malfunction with MONITOR BP cuff.

## 2015-12-22 NOTE — Discharge Instructions (Signed)
You were seen in the emergency department and diagnosed with atrial fibrillation. Please take metoprolol twice a day and aspirin once a day. Please call cardiologist Dr. Sharyn LullHarwani on Monday schedule an appointment as soon as possible. Please do not exercise, or engage in strenuous activity until being seen by Dr. Sharyn LullHarwani. You were given a work note to excuse you from work until you have seen the cardiologist. If you have heart palpitations, chest pain, shortness of breath return to the ED for evaluation

## 2015-12-22 NOTE — ED Provider Notes (Signed)
Physical Exam  BP 126/81 (BP Location: Right Arm)   Pulse 60   Temp 98.2 F (36.8 C) (Oral)   Resp 18   Ht 6\' 2"  (1.88 m)   Wt 185 lb (83.9 kg)   SpO2 100%   BMI 23.75 kg/m   Physical Exam  Constitutional: He appears well-developed and well-nourished.  HENT:  Head: Normocephalic and atraumatic.  Eyes: Conjunctivae are normal.  Neck: Neck supple.  Cardiovascular: Normal rate and regular rhythm.   No murmur heard. tachycardic  Pulmonary/Chest: Effort normal and breath sounds normal. No respiratory distress.  Abdominal: Soft. There is no tenderness.  Musculoskeletal: He exhibits no edema.  Neurological: He is alert.  Skin: Skin is warm and dry.  Psychiatric: He has a normal mood and affect.  Nursing note and vitals reviewed.   ED Course  .Cardioversion Date/Time: 12/22/2015 5:03 PM Performed by: Marily Memos Authorized by: Marily Memos   Consent:    Consent obtained:  Written and verbal   Consent given by:  Patient   Risks discussed:  Death, cutaneous burn, induced arrhythmia and pain   Alternatives discussed:  Rate-control medication Pre-procedure details:    Cardioversion basis:  Elective   Rhythm:  Atrial fibrillation   Electrode placement:  Anterior-posterior Attempt one:    Cardioversion mode:  Synchronous   Shock (Joules):  120   Shock outcome:  Conversion to normal sinus rhythm Post-procedure details:    Patient status:  Awake   Patient tolerance of procedure:  Tolerated well, no immediate complications .Sedation Date/Time: 12/22/2015 5:04 PM Performed by: Marily Memos Authorized by: Marily Memos   Consent:    Consent obtained:  Written   Consent given by:  Patient   Risks discussed:  Inadequate sedation, nausea, prolonged sedation necessitating reversal, respiratory compromise necessitating ventilatory assistance and intubation and vomiting   Alternatives discussed:  Anxiolysis and analgesia without sedation Indications:    Procedure performed:   Cardioversion   Procedure necessitating sedation performed by:  Physician performing sedation   Intended level of sedation:  Moderate (conscious sedation) Pre-sedation assessment:    Time since last food or drink:  3 hours   ASA classification: class 1 - normal, healthy patient     Neck mobility: normal     Mouth opening:  3 or more finger widths   Mallampati score:  II - soft palate, uvula, fauces visible   Pre-sedation assessments completed and reviewed: airway patency, cardiovascular function, hydration status, mental status, nausea/vomiting and pain level     History of difficult intubation: no     Pre-sedation assessment completed:  12/22/2015 12:45 PM Immediate pre-procedure details:    Reassessment: Patient reassessed immediately prior to procedure     Reviewed: vital signs, relevant labs/tests and NPO status     Verified: bag valve mask available, emergency equipment available, intubation equipment available, IV patency confirmed, oxygen available, reversal medications available and suction available   Procedure details (see MAR for exact dosages):    Sedation start time:  12/22/2015 12:56 PM   Preoxygenation:  Nasal cannula   Sedation:  Etomidate   Intra-procedure monitoring:  Cardiac monitor, blood pressure monitoring, continuous capnometry, continuous pulse oximetry, frequent LOC assessments and frequent vital sign checks   Intra-procedure events: airway compromise and respiratory depression     Sedation end time:  12/22/2015 1:22 PM Post-procedure details:    Post-sedation assessment completed:  12/22/2015 1:30 PM   Attendance: Constant attendance by certified staff until patient recovered     Recovery: Patient  returned to pre-procedure baseline     Estimated blood loss (see I/O flowsheets): no     Post-sedation assessments completed and reviewed: airway patency, cardiovascular function, hydration status, mental status, nausea/vomiting, pain level, respiratory function and  temperature     Specimens recovered:  None   Patient is stable for discharge or admission: yes     Patient tolerance:  Tolerated well, no immediate complications   CRITICAL CARE Performed by: Marily MemosMesner, Cielle Aguila Total critical care time: 35 minutes Critical care time was exclusive of separately billable procedures and treating other patients. Critical care was necessary to treat or prevent imminent or life-threatening deterioration. Critical care was time spent personally by me on the following activities: development of treatment plan with patient and/or surrogate as well as nursing, discussions with consultants, evaluation of patient's response to treatment, examination of patient, obtaining history from patient or surrogate, ordering and performing treatments and interventions, ordering and review of laboratory studies, ordering and review of radiographic studies, pulse oximetry and re-evaluation of patient's condition.   MDM 35 yo M here with afib w/ rvr. Initially thought to be possibly SVT so adenosine tried which showed underling rhythm of aflutter v afib. Fluids continued. Patient remembers the symptoms starting exactly last night and just not getting better, so started on ASA (CHADSVASC=0) and elecdtrically cardioverted successfully. D/W Dr. Sharyn LullHarwani (mostly because of age) and plan for ASA, lopressor and follow up with him on Wednesday or Thursday. Repeat ecg with sinus rhythm.    EKG Interpretation  Date/Time:  Saturday December 22 2015 11:20:19 EDT Ventricular Rate:  161 PR Interval:    QRS Duration: 86 QT Interval:  284 QTC Calculation: 464 R Axis:   57 Text Interpretation:  Atrial fibrillation with rapid ventricular response Abnormal ECG No old tracing to compare Confirmed by [facility], Kegan Mckeithan [phone]) on 12/22/2015 12:33:49 PM       EKG Interpretation  Date/Time:  Saturday December 22 2015 13:21:53 EDT Ventricular Rate:  61 PR Interval:    QRS Duration: 96 QT  Interval:  402 QTC Calculation: 405 R Axis:   42 Text Interpretation:  Sinus rhythm ST elev, probable normal early repol pattern Confirmed by [facility], Yoselyn Mcglade [phone]) on 12/22/2015 5:12:19 PM             Marily MemosJason Kingson Lohmeyer, MD 12/22/15 [id number]

## 2016-06-19 ENCOUNTER — Other Ambulatory Visit: Payer: Self-pay | Admitting: Internal Medicine

## 2016-06-19 DIAGNOSIS — E7800 Pure hypercholesterolemia, unspecified: Secondary | ICD-10-CM

## 2016-06-21 ENCOUNTER — Ambulatory Visit
Admission: RE | Admit: 2016-06-21 | Discharge: 2016-06-21 | Disposition: A | Discharge status: Home / Self Care | Payer: BLUE CROSS/BLUE SHIELD | Source: Ambulatory Visit | Attending: Internal Medicine | Admitting: Internal Medicine

## 2016-06-21 DIAGNOSIS — E7800 Pure hypercholesterolemia, unspecified: Secondary | ICD-10-CM

## 2018-05-24 ENCOUNTER — Other Ambulatory Visit: Payer: Self-pay | Admitting: Family Medicine

## 2018-05-24 ENCOUNTER — Ambulatory Visit: Payer: Self-pay

## 2018-05-24 DIAGNOSIS — M545 Low back pain, unspecified: Secondary | ICD-10-CM

## 2018-05-24 DIAGNOSIS — G8929 Other chronic pain: Secondary | ICD-10-CM

## 2020-04-24 ENCOUNTER — Other Ambulatory Visit: Payer: BLUE CROSS/BLUE SHIELD

## 2020-10-06 ENCOUNTER — Other Ambulatory Visit: Payer: Self-pay

## 2020-10-06 ENCOUNTER — Emergency Department (HOSPITAL_BASED_OUTPATIENT_CLINIC_OR_DEPARTMENT_OTHER)
Admission: EM | Admit: 2020-10-06 | Discharge: 2020-10-06 | Disposition: A | Discharge status: Home / Self Care | Payer: BC Managed Care – PPO | Attending: Emergency Medicine | Admitting: Emergency Medicine

## 2020-10-06 ENCOUNTER — Encounter (HOSPITAL_BASED_OUTPATIENT_CLINIC_OR_DEPARTMENT_OTHER): Payer: Self-pay | Admitting: *Deleted

## 2020-10-06 DIAGNOSIS — F17210 Nicotine dependence, cigarettes, uncomplicated: Secondary | ICD-10-CM | POA: Diagnosis not present

## 2020-10-06 DIAGNOSIS — Z7982 Long term (current) use of aspirin: Secondary | ICD-10-CM | POA: Insufficient documentation

## 2020-10-06 DIAGNOSIS — I480 Paroxysmal atrial fibrillation: Secondary | ICD-10-CM | POA: Diagnosis not present

## 2020-10-06 DIAGNOSIS — R002 Palpitations: Secondary | ICD-10-CM | POA: Diagnosis present

## 2020-10-06 HISTORY — DX: Unspecified atrial fibrillation: I48.91

## 2020-10-06 LAB — BASIC METABOLIC PANEL
Anion gap: 6 (ref 5–15)
BUN: 19 mg/dL (ref 6–20)
CO2: 27 mmol/L (ref 22–32)
Calcium: 9.3 mg/dL (ref 8.9–10.3)
Chloride: 103 mmol/L (ref 98–111)
Creatinine, Ser: 0.99 mg/dL (ref 0.61–1.24)
GFR, Estimated: >60 mL/min (ref >60)
Glucose, Bld: 91 mg/dL (ref 70–99)
Potassium: 3.9 mmol/L (ref 3.5–5.1)
Sodium: 136 mmol/L (ref 135–145)

## 2020-10-06 LAB — CBC WITH DIFFERENTIAL/PLATELET
Abs Immature Granulocytes: 0.02 10*3/uL (ref 0.00–0.07)
Basophils Absolute: 0 10*3/uL (ref 0.0–0.1)
Basophils Relative: 0 %
Eosinophils Absolute: 0.2 10*3/uL (ref 0.0–0.5)
Eosinophils Relative: 2 %
HCT: 44.8 % (ref 39.0–52.0)
Hemoglobin: 15.7 g/dL (ref 13.0–17.0)
Immature Granulocytes: 0 %
Lymphocytes Relative: 27 %
Lymphs Abs: 2.1 10*3/uL (ref 0.7–4.0)
MCH: 29.1 pg (ref 26.0–34.0)
MCHC: 35 g/dL (ref 30.0–36.0)
MCV: 83.1 fL (ref 80.0–100.0)
Monocytes Absolute: 0.7 10*3/uL (ref 0.1–1.0)
Monocytes Relative: 9 %
Neutro Abs: 4.8 10*3/uL (ref 1.7–7.7)
Neutrophils Relative %: 62 %
Platelets: 239 10*3/uL (ref 150–400)
RBC: 5.39 MIL/uL (ref 4.22–5.81)
RDW: 14.3 % (ref 11.5–15.5)
WBC: 7.8 10*3/uL (ref 4.0–10.5)
nRBC: 0 % (ref 0.0–0.2)

## 2020-10-06 LAB — MAGNESIUM: Magnesium: 2 mg/dL (ref 1.7–2.4)

## 2020-10-06 LAB — TSH: TSH: 1.248 u[IU]/mL (ref 0.350–4.500)

## 2020-10-06 MED ORDER — SODIUM CHLORIDE 0.9 % IV BOLUS
1000.0000 mL | Freq: Once | INTRAVENOUS | Status: AC
  Administered 2020-10-06: 1000 mL via INTRAVENOUS

## 2020-10-06 MED ORDER — PROPOFOL 10 MG/ML IV BOLUS
0.5000 mg/kg | Freq: Once | INTRAVENOUS | Status: AC
  Administered 2020-10-06: 46.5 mg via INTRAVENOUS
  Filled 2020-10-06: qty 20

## 2020-10-06 MED ORDER — PROPOFOL 10 MG/ML IV BOLUS
INTRAVENOUS | Status: AC | PRN
  Administered 2020-10-06: 60 mg via INTRAVENOUS

## 2020-10-06 MED ORDER — DILTIAZEM HCL 25 MG/5ML IV SOLN
10.0000 mg | Freq: Once | INTRAVENOUS | Status: AC
  Administered 2020-10-06: 10 mg via INTRAVENOUS
  Filled 2020-10-06: qty 5

## 2020-10-06 NOTE — ED Provider Notes (Signed)
[facility] EMERGENCY DEPT Provider Note   CSN: [id number] Arrival date & time: 10/06/20  1331     History No chief complaint on file.   David Swanson is a 40 y.o. male.  He has a history of paroxysmal A. fib.  He was seen by Dr. Sharyn Lull in the past and recommended to take beta-blocker metoprolol 25 twice daily.  He did not do that.  He was cardioverted in the past x1.  He is not on anticoagulation.  He said acutely at 3 AM he woke up and felt his heart fluttering.  He tried to go to work later that day but was feeling a little lightheaded so decided to come here.  Unfortunately he just last ate just prior to arrival.  No chest pain or shortness of breath.  He did have couple episodes of diarrhea overnight.  He thinks he might of been dehydrated yesterday.   The history is provided by the patient.  Palpitations Palpitations quality:  Fast Onset quality:  Sudden Duration:  12 hours Timing:  Constant Progression:  Unchanged Chronicity:  Recurrent Context: dehydration   Relieved by:  None tried Worsened by:  Nothing Ineffective treatments:  None tried Associated symptoms: no chest pain, no chest pressure, no diaphoresis, no nausea, no shortness of breath and no vomiting   Risk factors: hx of atrial fibrillation   Risk factors: no diabetes mellitus, no heart disease, no hx of PE, no hx of thyroid disease and no OTC sinus medications       Past Medical History:  Diagnosis Date  . A-fib (HCC)   . 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 Morton neuroma     Past Surgical History:  Procedure Laterality Date  . ANKLE ARTHROSCOPY Bilateral UNKNOWN   TYPE OF SURGERY B/L UNKNOWN       Family History  Problem Relation Age of Onset  . Cancer Mother     Social History   Tobacco Use  . Smoking status: Every Day    Packs/day: 0.25    Years: 5.00    Pack years: 1.25    Types: Cigarettes     Start date: 12/23/2007  . Smokeless tobacco: Never  Vaping Use  . Vaping Use: Never used  Substance Use Topics  . Alcohol use: Yes    Alcohol/week: 1.0 - 2.0 standard drink    Types: 1 - 2 Glasses of wine per week    Comment: occasioanlly  . Drug use: Not Currently    Types: Marijuana    Home Medications Prior to Admission medications   Medication Sig Start Date End Date Taking? Authorizing Provider  aspirin EC 325 MG tablet Take 1 tablet (325 mg total) by mouth daily. Patient taking differently: Take 81 mg by mouth daily. 12/22/15  Yes Haney, Alyssa A, MD  metoprolol tartrate (LOPRESSOR) 25 MG tablet Take 0.5 tablets (12.5 mg total) by mouth 2 (two) times daily. 12/22/15   Bonney Aid, MD    Allergies    Patient has no known allergies.  Review of Systems   Review of Systems  Constitutional:  Negative for diaphoresis and fever.  HENT:  Negative for sore throat.   Eyes:  Negative for visual disturbance.  Respiratory:  Negative for shortness of breath.   Cardiovascular:  Positive for palpitations. Negative for chest pain.  Gastrointestinal:  Negative for abdominal pain, nausea and vomiting.  Genitourinary:  Negative for dysuria.  Musculoskeletal:  Negative for neck pain.  Skin:  Negative for rash.  Neurological:  Positive for light-headedness. Negative for headaches.   Physical Exam Updated Vital Signs BP 109/78   Pulse 97   Temp 97.8 F (36.6 C)   Resp 15   Ht 6\' 2"  (1.88 m)   Wt 93 kg   SpO2 100%   BMI 26.32 kg/m   Physical Exam Vitals and nursing note reviewed.  Constitutional:      Appearance: Normal appearance. He is well-developed.  HENT:     Head: Normocephalic and atraumatic.  Eyes:     Conjunctiva/sclera: Conjunctivae normal.  Cardiovascular:     Rate and Rhythm: Normal rate and regular rhythm.     Heart sounds: No murmur heard. Pulmonary:     Effort: Pulmonary effort is normal. No respiratory distress.     Breath sounds: Normal breath sounds.   Abdominal:     Palpations: Abdomen is soft.     Tenderness: There is no abdominal tenderness.  Musculoskeletal:        General: No deformity or signs of injury. Normal range of motion.     Cervical back: Neck supple.  Skin:    General: Skin is warm and dry.  Neurological:     General: No focal deficit present.     Mental Status: He is alert.    ED Results / Procedures / Treatments   Labs (all labs ordered are listed, but only abnormal results are displayed) Labs Reviewed  BASIC METABOLIC PANEL  CBC WITH DIFFERENTIAL/PLATELET  MAGNESIUM    EKG EKG Interpretation  Date/Time:  Saturday October 06 2020 13:41:54 EDT Ventricular Rate:  118 PR Interval:    QRS Duration: 98 QT Interval:  296 QTC Calculation: 414 R Axis:   31 Text Interpretation: Atrial fibrillation with rapid ventricular response T wave abnormality, consider inferior ischemia Abnormal ECG Since last tracing afib has replaced Sinus rhythm Confirmed by [id number] [phone]) on 10/06/2020 2:00:15 PM  Radiology No results found.  Procedures .Cardioversion  Date/Time: 10/06/2020 6:08 PM Performed by: 12/07/2020, MD Authorized by: Terrilee Files, MD   Consent:    Consent obtained:  Written   Consent given by:  Patient   Risks discussed:  Cutaneous burn, induced arrhythmia and pain   Alternatives discussed:  Delayed treatment, alternative treatment and referral Pre-procedure details:    Cardioversion basis:  Elective   Rhythm:  Atrial fibrillation   Electrode placement:  Anterior-posterior Patient sedated: Yes. Refer to sedation procedure documentation for details of sedation.  Attempt one:    Cardioversion mode:  Synchronous   Shock (Joules):  150   Shock outcome:  Conversion to normal sinus rhythm Post-procedure details:    Patient status:  Awake   Patient tolerance of procedure:  Tolerated well, no immediate complications .Sedation  Date/Time: 10/06/2020 6:09 PM Performed by: 12/07/2020, MD Authorized by: Terrilee Files, MD   Consent:    Consent obtained:  Written   Consent given by:  Patient   Risks discussed:  Prolonged hypoxia resulting in organ damage, prolonged sedation necessitating reversal, inadequate sedation, respiratory compromise necessitating ventilatory assistance and intubation, nausea and vomiting   Alternatives discussed:  Analgesia without sedation Universal protocol:    Procedure explained and questions answered to patient or proxy's satisfaction: yes     Relevant documents present and verified: yes     Test results available: yes     Imaging studies available: yes  Required blood products, implants, devices, and special equipment available: yes     Immediately prior to procedure, a time out was called: yes     Patient identity confirmed:  Verbally with patient Indications:    Procedure performed:  Cardioversion Pre-sedation assessment:    Time since last food or drink:  4   ASA classification: class 1 - normal, healthy patient     Mouth opening:  3 or more finger widths   Thyromental distance:  4 finger widths   Mallampati score:  I - soft palate, uvula, fauces, pillars visible   Neck mobility: normal     Pre-sedation assessments completed and reviewed: pre-procedure airway patency not reviewed, pre-procedure cardiovascular function not reviewed, pre-procedure hydration status not reviewed, pre-procedure mental status not reviewed, pre-procedure nausea and vomiting status not reviewed, pre-procedure pain level not reviewed and pre-procedure respiratory function not reviewed     Pre-sedation assessment completed:  10/06/2020 5:49 PM Immediate pre-procedure details:    Reassessment: Patient reassessed immediately prior to procedure     Reviewed: vital signs, relevant labs/tests and NPO status     Verified: bag valve mask available, emergency equipment available, intubation equipment available, IV patency confirmed, oxygen available and  suction available   Procedure details (see MAR for exact dosages):    Preoxygenation:  Nasal cannula   Sedation:  Propofol   Intended level of sedation: deep   Intra-procedure monitoring:  Blood pressure monitoring, cardiac monitor, continuous pulse oximetry, continuous capnometry, frequent LOC assessments and frequent vital sign checks   Intra-procedure events: none     Total Provider sedation time (minutes):  15 Post-procedure details:    Post-sedation assessment completed:  10/06/2020 6:33 PM   Attendance: Constant attendance by certified staff until patient recovered     Recovery: Patient returned to pre-procedure baseline     Post-sedation assessments completed and reviewed: post-procedure airway patency not reviewed, post-procedure cardiovascular function not reviewed, post-procedure mental status not reviewed, post-procedure nausea and vomiting status not reviewed, pain score not reviewed and post-procedure respiratory function not reviewed     Patient is stable for discharge or admission: yes     Procedure completion:  Tolerated well, no immediate complications .Critical Care  Date/Time: 10/07/2020 9:40 AM Performed by: Terrilee FilesButler, Jazzmen Restivo C, MD Authorized by: Terrilee FilesButler, Jaydalee Bardwell C, MD   Critical care provider statement:    Critical care time (minutes):  30   Critical care time was exclusive of:  Separately billable procedures and treating other patients   Critical care was necessary to treat or prevent imminent or life-threatening deterioration of the following conditions:  Cardiac failure   Critical care was time spent personally by me on the following activities:  Evaluation of patient's response to treatment, examination of patient, ordering and performing treatments and interventions, ordering and review of laboratory studies, ordering and review of radiographic studies, pulse oximetry, re-evaluation of patient's condition, obtaining history from patient or surrogate, review of old charts  and development of treatment plan with patient or surrogate   Medications Ordered in ED Medications  sodium chloride 0.9 % bolus 1,000 mL (0 mLs Intravenous Stopped 10/06/20 1641)  diltiazem (CARDIZEM) injection 10 mg (10 mg Intravenous Given 10/06/20 1552)  propofol (DIPRIVAN) 10 mg/mL bolus/IV push 46.5 mg (46.5 mg Intravenous Given 10/06/20 1800)  propofol (DIPRIVAN) 10 mg/mL bolus/IV push (60 mg Intravenous Given 10/06/20 1804)    ED Course  I have reviewed the triage vital signs and the nursing notes.  Pertinent labs & imaging results that  were available during my care of the patient were reviewed by me and considered in my medical decision making (see chart for details).  Clinical Course as of 10/07/20 0940  Sat Oct 06, 2020  1727 Patient's been here on the monitor for 2 and half hours and still remains in A. fib.  Has been given a dose of Cardizem with slowing of his rate but did not convert.  We had a discussion of cardioversion and he would like to proceed with that. [MB]  1823 Repeat EKG after cardioversion shows patient in normal sinus rhythm with a rate of about 60 normal intervals. [MB]    Clinical Course User Index [MB] Terrilee Files, MD   MDM Rules/Calculators/A&P                         This patient complains of palpitations; this involves an extensive number of treatment Options and is a complaint that carries with it a high risk of complications and Morbidity. The differential includes paroxysmal A. fib, SVT, sinus tachycardia, anemia, metabolic derangement, hyperthyroidism  I ordered, reviewed and interpreted labs, which included CBC with normal white count normal hemoglobin, chemistries normal, TSH normal I ordered medication IV fluids and IV Cardizem for rate control.  Unfortunately patient did not convert spontaneously. Additional history obtained from patient's wife Previous records obtained and reviewed in epic, 1 prior cardioversion for A. fib  Critical  Interventions: Procedural sedation and cardioversion  After the interventions stated above, I reevaluated the patient and found patient to be hemodynamically stable and symptomatically improved.  He is back in sinus rhythm.  He is asking for referral to a different cardiologist.  Placed referral to A. fib clinic.  CHA2DS2-VASc is low and no indication currently for anticoagulation.  This patients CHA2DS2-VASc Score and unadjusted Ischemic Stroke Rate (% per year) is equal to 0.2 % stroke rate/year from a score of 0  Above score calculated as 1 point each if present [CHF, HTN, DM, Vascular=MI/PAD/Aortic Plaque, Age if 65-74, or Male] Above score calculated as 2 points each if present [Age > 75, or Stroke/TIA/TE]     Final Clinical Impression(s) / ED Diagnoses Final diagnoses:  PAF (paroxysmal atrial fibrillation) (HCC)    Rx / DC Orders ED Discharge Orders     None        Terrilee Files, MD 10/07/20 [phone]

## 2020-10-06 NOTE — ED Triage Notes (Signed)
Patient felt his heart is fluttering (HR 120 while lying in the couch) today.  Has history of A-fib but refused to take any medicine.

## 2020-10-06 NOTE — Sedation Documentation (Signed)
Shock delivered  

## 2020-10-06 NOTE — Discharge Instructions (Addendum)
You are seen in the emergency department for evaluation of palpitations.  You were back in atrial fibrillation.  Your lab work did not show any significant abnormalities.  You were cardioverted and returned to normal sinus rhythm.  Please take a full dose aspirin daily.  The A. fib clinic should reach out to you for follow-up appointment.

## 2020-10-06 NOTE — ED Notes (Signed)
He is awake and tells me he feels "better". Average heart rate now is 65. Still in a-fib.

## 2020-10-17 ENCOUNTER — Encounter (HOSPITAL_COMMUNITY): Payer: Self-pay | Admitting: Nurse Practitioner

## 2020-10-17 ENCOUNTER — Ambulatory Visit (HOSPITAL_COMMUNITY)
Admission: RE | Admit: 2020-10-17 | Discharge: 2020-10-17 | Disposition: A | Discharge status: Home / Self Care | Payer: BC Managed Care – PPO | Source: Ambulatory Visit | Attending: Nurse Practitioner | Admitting: Nurse Practitioner

## 2020-10-17 ENCOUNTER — Other Ambulatory Visit: Payer: Self-pay

## 2020-10-17 VITALS — BP 124/74 | HR 54 | Ht 74.0 in | Wt 195.4 lb

## 2020-10-17 DIAGNOSIS — I480 Paroxysmal atrial fibrillation: Secondary | ICD-10-CM | POA: Diagnosis not present

## 2020-10-17 DIAGNOSIS — Z7901 Long term (current) use of anticoagulants: Secondary | ICD-10-CM | POA: Diagnosis not present

## 2020-10-17 DIAGNOSIS — F17210 Nicotine dependence, cigarettes, uncomplicated: Secondary | ICD-10-CM | POA: Diagnosis not present

## 2020-10-17 MED ORDER — DILTIAZEM HCL 30 MG PO TABS: ORAL_TABLET | ORAL | 1 refills | Status: DC

## 2020-10-17 MED ORDER — RIVAROXABAN 20 MG PO TABS
20.0000 mg | ORAL_TABLET | Freq: Every day | ORAL | 0 refills | Status: DC
Start: 2020-10-17 — End: 2021-05-17

## 2020-10-17 NOTE — Progress Notes (Addendum)
Primary Care Physician: Patient, No Pcp Per (Inactive) Referring Physician: [facility] ER visit    David Swanson is a 40 y.o. male with a h/o afib dx 6 yrs ago in 2017. He had a successful CV at at that time. He has only noted a couple of short episodes that have occurred since then. He had an echo at the time and was told it was normal. Usually if  he goes back to sleep, for a mild afib episode, he will wake up in SR. On 7/2, he woke up with heart fluttering around 3 am. When he woke up to go to work, that am,  his heart was still fluttering and he  felt lightheaded so he decided to go to ER. He felt that dehydration from  the day before and also drinking an energy drink and 2 Cokes contributed. He does not believe he drank alcohol that night. He does not usually drink caffeine containing  drinks, other than 2 cups of coffee that am. He smokes 2-3 cigarettes a day and 3-4 times a week, will drink up to 3 alcoholic drinks a day. States he does not snore. He does go to the gym on a regular basis. 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   He had successful cardioversion in the ER. He is not on daily rate control. He is not anticoagulated with a CHA2DS2VASc score of 0. He remains in SR today.   Today, he denies symptoms of palpitations, chest pain, shortness of breath, orthopnea, PND, lower extremity edema, dizziness, presyncope, syncope, or neurologic sequela. The patient is tolerating medications without difficulties and is otherwise without complaint today.   Past Medical History:  Diagnosis Date   A-fib [facility])    Morton neuroma [id number]   LEFT 3 RD INTERSPACE   Past Surgical History:  Procedure Laterality Date   ANKLE ARTHROSCOPY Bilateral UNKNOWN   TYPE OF SURGERY B/L UNKNOWN    Current Outpatient Medications  Medication Sig Dispense Refill   Ascorbic Acid (VITAMIN C) 1000 MG tablet Take 500 mg by mouth daily.     diltiazem (CARDIZEM) 30 MG tablet Take 1 tablet every 4 hours AS  NEEDED for heart rate >100 30 tablet 1   famotidine (PEPCID) 20 MG tablet Take 20 mg by mouth as needed.     Multiple Vitamin (MULTIVITAMIN PO) Beet Root Powder-Taking 1 capsule by mouth daily     rivaroxaban (XARELTO) 20 MG TABS tablet Take 1 tablet (20 mg total) by mouth daily with supper. 30 tablet 0   VITAMIN D PO Taking 1 tablet by mouth daily-     Current Facility-Administered Medications  Medication Dose Route Frequency Provider Last Rate Last Admin   triamcinolone acetonide (KENALOG) 10 MG/ML injection 10 mg  10 mg Other Once Regal, Norman S, DPM        No Known Allergies  Social History   Socioeconomic History   Marital status: Single    Spouse name: Not on file   Number of children: Not on file   Years of education: Not on file   Highest education level: Not on file  Occupational History   Not on file  Tobacco Use   Smoking status: Every Day    Packs/day: 0.25    Years: 5.00    Pack years: 1.25    Types: Cigarettes    Start date: 12/23/2007   Smokeless tobacco: Never  Vaping Use   Vaping Use:  Never used  Substance and Sexual Activity   Alcohol use: Yes    Alcohol/week: 1.0 - 2.0 standard drink    Types: 1 - 2 Glasses of wine per week    Comment: occasioanlly   Drug use: Not Currently    Types: Marijuana   Sexual activity: Yes  Other Topics Concern   Not on file  Social History Narrative   Not on file   Social Determinants of Health   Financial Resource Strain: Not on file  Food Insecurity: Not on file  Transportation Needs: Not on file  Physical Activity: Not on file  Stress: Not on file  Social Connections: Not on file  Intimate Partner Violence: Not on file    Family History  Problem Relation Age of Onset   Cancer Mother     ROS- All systems are reviewed and negative except as per the HPI above  Physical Exam: Vitals:   10/17/20 0836  BP: 124/74  Pulse: (!) 54  Weight: 88.6 kg  Height: 6\' 2"  (1.88 m)   Wt Readings from Last 3  Encounters:  10/17/20 88.6 kg  10/06/20 93 kg  12/22/15 83.9 kg    Labs: Lab Results  Component Value Date   NA 136 10/06/2020   K 3.9 10/06/2020   CL 103 10/06/2020   CO2 27 10/06/2020   GLUCOSE 91 10/06/2020   BUN 19 10/06/2020   CREATININE 0.99 10/06/2020   CALCIUM 9.3 10/06/2020   MG 2.0 10/06/2020   No results found for: INR No results found for: CHOL, HDL, LDLCALC, TRIG   GEN- The patient is well appearing, alert and oriented x 3 today.   Head- normocephalic, atraumatic Eyes-  Sclera clear, conjunctiva pink Ears- hearing intact Oropharynx- clear Neck- supple, no JVP Lymph- no cervical lymphadenopathy Lungs- Clear to ausculation bilaterally, normal work of breathing Heart- Regular rate and rhythm, no murmurs, rubs or gallops, PMI not laterally displaced GI- soft, NT, ND, + BS Extremities- no clubbing, cyanosis, or edema MS- no significant deformity or atrophy Skin- no rash or lesion Psych- euthymic mood, full affect Neuro- strength and sensation are intact  EKG-Sinus brady at 54 bpm, pr int 152 ms, qrs int 106 ms, qtc 417 ms   Echo- scheduled   Assessment and Plan:  1. Paroxysmal afib  Only 2 prolonged episodes, initially in 2017 and again on 10/06/20.  Formally followed by Dr. 12/07/20  Both with successful cardioversion General education and triggers discussed Will rx with 30 mg Cardizem as needed for afib episodes He prefers to avoid daily rate control and with a heart rat of 54 bpm, I would not recommend for this reason as well  If afib escalates in the future, could be consider for an afib  ablation Echo ordered     2. CHA2DS2VASc  score of 0  Per  AFIB protocol with ER cardioversion's, all pt's, despite low CHA2DS2VASc  score should be on anticoagulation for 4 weeks after cardioversion.  Since it has been 2 weeks since cardioversion,( pt delayed appointment coming in)  I checked with PharmD over anticoagulation clinic and she still recommends that pt  take anticoagulation x 4 weeks I will prescribe xarelto 20 mg daily at supper for 4 weeks 30 day free card given  Bleeding precautions discussed   3. Lifestyle issues contributing to afib  Tobacco  cessation encouraged  Alcohol cessation encouraged  Avoid energy drinks  Stay hydrated  Continue regular exercise  I will call pt with echo results  and determine f/u Afib clinic as needed  Lupita Leash C. Matthew Folks [facility] [address] [place], [place] [address] [phone]

## 2020-10-17 NOTE — Patient Instructions (Signed)
Stop aspirin  Start Xarelto 20mg  once a day with supper  Cardizem 30mg  -- take 1 tablet every 4 hours AS NEEDED for heart rate >100

## 2020-11-05 ENCOUNTER — Ambulatory Visit (HOSPITAL_COMMUNITY)
Admission: RE | Admit: 2020-11-05 | Discharge: 2020-11-05 | Disposition: A | Discharge status: Home / Self Care | Payer: BC Managed Care – PPO | Source: Ambulatory Visit | Attending: Nurse Practitioner | Admitting: Nurse Practitioner

## 2020-11-05 ENCOUNTER — Other Ambulatory Visit: Payer: Self-pay

## 2020-11-05 DIAGNOSIS — I4891 Unspecified atrial fibrillation: Secondary | ICD-10-CM | POA: Insufficient documentation

## 2020-11-05 DIAGNOSIS — I480 Paroxysmal atrial fibrillation: Secondary | ICD-10-CM

## 2020-11-05 DIAGNOSIS — F17200 Nicotine dependence, unspecified, uncomplicated: Secondary | ICD-10-CM | POA: Insufficient documentation

## 2020-11-05 DIAGNOSIS — I071 Rheumatic tricuspid insufficiency: Secondary | ICD-10-CM | POA: Diagnosis not present

## 2020-11-05 LAB — ECHOCARDIOGRAM COMPLETE
Area-P 1/2: 4.1 cm2
Calc EF: 63.4 %
S' Lateral: 3.5 cm
Single Plane A2C EF: 67.7 %
Single Plane A4C EF: 60.4 %

## 2020-11-05 NOTE — Progress Notes (Signed)
  Echocardiogram 2D Echocardiogram has been performed.  David Swanson 11/05/2020, 3:45 PM

## 2020-11-15 ENCOUNTER — Encounter (HOSPITAL_COMMUNITY): Payer: Self-pay | Admitting: *Deleted

## 2021-05-17 ENCOUNTER — Ambulatory Visit (HOSPITAL_COMMUNITY)
Admission: RE | Admit: 2021-05-17 | Discharge: 2021-05-17 | Disposition: A | Discharge status: Home / Self Care | Payer: BC Managed Care – PPO | Source: Ambulatory Visit | Attending: Physician Assistant | Admitting: Physician Assistant

## 2021-05-17 ENCOUNTER — Encounter (HOSPITAL_COMMUNITY): Payer: Self-pay | Admitting: Physician Assistant

## 2021-05-17 ENCOUNTER — Other Ambulatory Visit: Payer: Self-pay

## 2021-05-17 VITALS — BP 118/82 | HR 153 | Ht 74.0 in | Wt 202.0 lb

## 2021-05-17 DIAGNOSIS — F1090 Alcohol use, unspecified, uncomplicated: Secondary | ICD-10-CM | POA: Diagnosis not present

## 2021-05-17 DIAGNOSIS — R000 Tachycardia, unspecified: Secondary | ICD-10-CM | POA: Insufficient documentation

## 2021-05-17 DIAGNOSIS — Z7901 Long term (current) use of anticoagulants: Secondary | ICD-10-CM | POA: Insufficient documentation

## 2021-05-17 DIAGNOSIS — F17210 Nicotine dependence, cigarettes, uncomplicated: Secondary | ICD-10-CM | POA: Diagnosis not present

## 2021-05-17 DIAGNOSIS — Z79899 Other long term (current) drug therapy: Secondary | ICD-10-CM | POA: Insufficient documentation

## 2021-05-17 DIAGNOSIS — I480 Paroxysmal atrial fibrillation: Secondary | ICD-10-CM

## 2021-05-17 DIAGNOSIS — I482 Chronic atrial fibrillation, unspecified: Secondary | ICD-10-CM | POA: Insufficient documentation

## 2021-05-17 DIAGNOSIS — R61 Generalized hyperhidrosis: Secondary | ICD-10-CM | POA: Insufficient documentation

## 2021-05-17 MED ORDER — RIVAROXABAN 20 MG PO TABS: 20.0000 mg | ORAL_TABLET | Freq: Every day | ORAL | 3 refills | Status: DC

## 2021-05-17 MED ORDER — METOPROLOL TARTRATE 25 MG PO TABS: 25.0000 mg | ORAL_TABLET | Freq: Two times a day (BID) | ORAL | 3 refills | Status: DC

## 2021-05-17 NOTE — Progress Notes (Signed)
Primary Care Physician: Patient, No Pcp Per (Inactive) Referring Physician: [facility] ER visit    David Swanson is a 41 y.o. male with a h/o afib dx 6 yrs ago in 2017. He had a successful CV at at that time. He has only noted a couple of short episodes that have occurred since then. He had an echo at the time and was told it was normal. Usually if  he goes back to sleep, for a mild afib episode, he will wake up in SR. On 7/2, he woke up with heart fluttering around 3 am. When he woke up to go to work, that am,  his heart was still fluttering and he  felt lightheaded so he decided to go to ER. He felt that dehydration from  the day before and also drinking an energy drink and 2 Cokes contributed. He does not believe he drank alcohol that night. He does not usually drink caffeine containing  drinks, other than 2 cups of coffee that am. He smokes 2-3 cigarettes a day and 3-4 times a week, will drink up to 3 alcoholic drinks a day. States he does not snore. He does go to the gym on a regular basis. 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   He had successful cardioversion in the ER. He is not on daily rate control. He is not anticoagulated with a CHA2DS2VASc score of 0. He remains in SR today.   Follow up in the AF clinic 05/17/21. Patient woke at 2am with tachypalpitations and "heaviness" in his chest. He does report taking several supplements prior to the onset of his symptoms as he was planning on starting a workout program. He does feel "clammy".   Today, he denies symptoms of shortness of breath, orthopnea, PND, lower extremity edema, dizziness, presyncope, syncope, or neurologic sequela. The patient is tolerating medications without difficulties and is otherwise without complaint today.   Past Medical History:  Diagnosis Date   A-fib [facility])    Morton neuroma [id number]   LEFT 3 RD INTERSPACE   Past Surgical History:  Procedure Laterality Date   ANKLE ARTHROSCOPY Bilateral UNKNOWN    TYPE OF SURGERY B/L UNKNOWN    Current Outpatient Medications  Medication Sig Dispense Refill   metoprolol tartrate (LOPRESSOR) 25 MG tablet Take 1 tablet (25 mg total) by mouth 2 (two) times daily. 60 tablet 3   Multiple Vitamin (MULTIVITAMIN PO) Beet Root Powder-Taking 1 capsule by mouth daily     rivaroxaban (XARELTO) 20 MG TABS tablet Take 1 tablet (20 mg total) by mouth daily with supper. 30 tablet 3   Current Facility-Administered Medications  Medication Dose Route Frequency Provider Last Rate Last Admin   triamcinolone acetonide (KENALOG) 10 MG/ML injection 10 mg  10 mg Other Once Regal, Norman S, DPM        No Known Allergies  Social History   Socioeconomic History   Marital status: Single    Spouse name: Not on file   Number of children: Not on file   Years of education: Not on file   Highest education level: Not on file  Occupational History   Not on file  Tobacco Use   Smoking status: Every Day    Packs/day: 0.25    Years: 5.00    Pack years: 1.25    Types: Cigarettes    Start date: 12/23/2007   Smokeless tobacco: Never   Tobacco comments:    Half pack daily  05/17/21  Vaping Use   Vaping Use: Never used  Substance and Sexual Activity   Alcohol use: Not Currently    Alcohol/week: 1.0 - 2.0 standard drink    Types: 1 - 2 Glasses of wine per week    Comment: 05/17/21   Drug use: Not Currently    Types: Marijuana   Sexual activity: Yes  Other Topics Concern   Not on file  Social History Narrative   Not on file   Social Determinants of Health   Financial Resource Strain: Not on file  Food Insecurity: Not on file  Transportation Needs: Not on file  Physical Activity: Not on file  Stress: Not on file  Social Connections: Not on file  Intimate Partner Violence: Not on file    Family History  Problem Relation Age of Onset   Cancer Mother     ROS- All systems are reviewed and negative except as per the HPI above  Physical Exam: Vitals:   05/17/21  1142  BP: 118/82  Pulse: (!) 153  Weight: 91.6 kg  Height: 6\' 2"  (1.88 m)    Wt Readings from Last 3 Encounters:  05/17/21 91.6 kg  10/17/20 88.6 kg  10/06/20 93 kg    Labs: Lab Results  Component Value Date   NA 136 10/06/2020   K 3.9 10/06/2020   CL 103 10/06/2020   CO2 27 10/06/2020   GLUCOSE 91 10/06/2020   BUN 19 10/06/2020   CREATININE 0.99 10/06/2020   CALCIUM 9.3 10/06/2020   MG 2.0 10/06/2020   No results found for: INR No results found for: CHOL, HDL, LDLCALC, TRIG   GEN- The patient is a well appearing male, alert and oriented x 3 today.   HEENT-head normocephalic, atraumatic, sclera clear, conjunctiva pink, hearing intact, trachea midline. Lungs- Clear to ausculation bilaterally, normal work of breathing Heart- irregular rate and rhythm, no murmurs, rubs or gallops  GI- soft, NT, ND, + BS Extremities- no clubbing, cyanosis, or edema MS- no significant deformity or atrophy Skin- no rash or lesion Psych- euthymic mood, full affect Neuro- strength and sensation are intact   EKG- Afib, RVR Vent. rate 153 BPM PR interval 200 ms QRS duration 92 ms QT/QTcB 262/418 ms  Echo- 11/05/20  1. Left ventricular ejection fraction, by estimation, is 60 to 65%. The left ventricle has normal function. The left ventricle has no regional wall motion abnormalities. Left ventricular diastolic parameters were normal.   2. Right ventricular systolic function is normal. The right ventricular size is normal.   3. The mitral valve is normal in structure. Trivial mitral valve  regurgitation.   4. The aortic valve is normal in structure. Aortic valve regurgitation is not visualized.   5. The inferior vena cava is normal in size with greater than 50% respiratory variability, suggesting right atrial pressure of 3 mmHg.   Assessment and Plan:  1. Paroxysmal afib   Formally followed by Dr. 01/05/21  Patient back in rapid afib today. He is tachycardia and diaphoretic on exam today.  BP within normal range. Recommended evaluation in ED for possible urgent DCCV. Patient refused. He would like to try outpatient treatment first. Start Lopressor 25 mg BID Start Xarelto 20 mg daily Gave patient strict ED precautions if his symptoms should get any worse. Encouraged to avoid workout supplements. Long-term, I think he would be a good candidate for afib ablation.     2. CHA2DS2VASc  score of 0  Start Xarelto 20 mg daily, will not  need this long term.    Follow up in the AF clinic early next week.    Jorja Loa PA-C Afib Clinic [facility] [address] [place], [place] [address] [phone]

## 2021-05-17 NOTE — Patient Instructions (Signed)
Start metoprolol 25mg  twice a day  Start Xarelto 20mg  once a day with food

## 2021-05-18 ENCOUNTER — Emergency Department (HOSPITAL_BASED_OUTPATIENT_CLINIC_OR_DEPARTMENT_OTHER)
Admission: EM | Admit: 2021-05-18 | Discharge: 2021-05-18 | Disposition: A | Discharge status: Home / Self Care | Payer: BC Managed Care – PPO | Attending: Emergency Medicine | Admitting: Emergency Medicine

## 2021-05-18 ENCOUNTER — Encounter (HOSPITAL_BASED_OUTPATIENT_CLINIC_OR_DEPARTMENT_OTHER): Payer: Self-pay

## 2021-05-18 ENCOUNTER — Other Ambulatory Visit: Payer: Self-pay

## 2021-05-18 ENCOUNTER — Emergency Department (HOSPITAL_BASED_OUTPATIENT_CLINIC_OR_DEPARTMENT_OTHER): Payer: BC Managed Care – PPO

## 2021-05-18 DIAGNOSIS — I480 Paroxysmal atrial fibrillation: Secondary | ICD-10-CM | POA: Diagnosis not present

## 2021-05-18 DIAGNOSIS — Z7901 Long term (current) use of anticoagulants: Secondary | ICD-10-CM | POA: Insufficient documentation

## 2021-05-18 DIAGNOSIS — Z79899 Other long term (current) drug therapy: Secondary | ICD-10-CM | POA: Insufficient documentation

## 2021-05-18 DIAGNOSIS — R0602 Shortness of breath: Secondary | ICD-10-CM

## 2021-05-18 DIAGNOSIS — R42 Dizziness and giddiness: Secondary | ICD-10-CM | POA: Diagnosis present

## 2021-05-18 LAB — BASIC METABOLIC PANEL
Anion gap: 7 (ref 5–15)
BUN: 20 mg/dL (ref 6–20)
CO2: 23 mmol/L (ref 22–32)
Calcium: 9.9 mg/dL (ref 8.9–10.3)
Chloride: 105 mmol/L (ref 98–111)
Creatinine, Ser: 0.93 mg/dL (ref 0.61–1.24)
GFR, Estimated: >60 mL/min (ref >60)
Glucose, Bld: 100 mg/dL — ABNORMAL HIGH (ref 70–99)
Potassium: 4.9 mmol/L (ref 3.5–5.1)
Sodium: 135 mmol/L (ref 135–145)

## 2021-05-18 LAB — CBC
HCT: 49.8 % (ref 39.0–52.0)
Hemoglobin: 16.9 g/dL (ref 13.0–17.0)
MCH: 28.2 pg (ref 26.0–34.0)
MCHC: 33.9 g/dL (ref 30.0–36.0)
MCV: 83 fL (ref 80.0–100.0)
Platelets: 259 10*3/uL (ref 150–400)
RBC: 6 MIL/uL — ABNORMAL HIGH (ref 4.22–5.81)
RDW: 13.7 % (ref 11.5–15.5)
WBC: 8.6 10*3/uL (ref 4.0–10.5)
nRBC: 0 % (ref 0.0–0.2)

## 2021-05-18 MED ORDER — PROPOFOL 10 MG/ML IV BOLUS
1.0000 mg/kg | Freq: Once | INTRAVENOUS | Status: AC
  Administered 2021-05-18: 90 mg via INTRAVENOUS
  Filled 2021-05-18: qty 20

## 2021-05-18 NOTE — ED Provider Notes (Signed)
[facility] EMERGENCY DEPT Provider Note   CSN: [id number] Arrival date & time: 05/18/21  1058     History  Chief Complaint  Patient presents with   Dizziness    David Swanson is a 41 y.o. male.  HPI     41 year old male with a history of paroxysmal atrial fibrillation who was seen yesterday by the cardiologist with episode of atrial fibrillation that began at 2 AM yesterday morning and was started on Xarelto and metoprolol, presents with continued atrial fibrillation symptoms and desire for cardioversion.  Hx of afib, a few episodes in past, some self resolved (associ mucinex, tamiflu, supplement) Has been cardioverted in the past Has not had issues in a long time until yesterday 2AM woke up with lightheadedness, heart pounding, feeling diaphoretic, weakness.   Took metoprolol and xarelto took this AM and yesterday at noon  Symptoms improved, but can tell in atrial fibrillation  No chest pain, dyspnea, n/v/diarrhea/sore throat  Last time eat or drink was 930-10   Past Medical History:  Diagnosis Date   A-fib [facility])    Morton neuroma [id number]   LEFT 3 RD INTERSPACE     Home Medications Prior to Admission medications   Medication Sig Start Date End Date Taking? Authorizing Provider  metoprolol tartrate (LOPRESSOR) 25 MG tablet Take 1 tablet (25 mg total) by mouth 2 (two) times daily. 05/17/21 05/17/22  Fenton, Clint R, PA  Multiple Vitamin (MULTIVITAMIN PO) Beet Root Powder-Taking 1 capsule by mouth daily    [provider]  rivaroxaban (XARELTO) 20 MG TABS tablet Take 1 tablet (20 mg total) by mouth daily with supper. 05/17/21   Fenton, Clint R, PA      Allergies    Patient has no known allergies.    Review of Systems   Review of Systems See above  Physical Exam Updated Vital Signs BP 122/81    Pulse 61    Temp 97.7 F (36.5 C) (Oral)    Resp 11    Ht 6\' 2"  (1.88 m)    Wt 91.6 kg    SpO2 100%    BMI 25.93 kg/m  Physical Exam Vitals  and nursing note reviewed.  Constitutional:      General: He is not in acute distress.    Appearance: He is well-developed. He is not diaphoretic.  HENT:     Head: Normocephalic and atraumatic.  Eyes:     Conjunctiva/sclera: Conjunctivae normal.  Cardiovascular:     Rate and Rhythm: Normal rate. Rhythm irregular.     Heart sounds: Normal heart sounds. No murmur heard.   No friction rub. No gallop.  Pulmonary:     Effort: Pulmonary effort is normal. No respiratory distress.     Breath sounds: Normal breath sounds. No wheezing or rales.  Abdominal:     General: There is no distension.     Palpations: Abdomen is soft.     Tenderness: There is no abdominal tenderness. There is no guarding.  Musculoskeletal:     Cervical back: Normal range of motion.  Skin:    General: Skin is warm and dry.  Neurological:     Mental Status: He is alert and oriented to person, place, and time.    ED Results / Procedures / Treatments   Labs (all labs ordered are listed, but only abnormal results are displayed) Labs Reviewed  BASIC METABOLIC PANEL - Abnormal; Notable for the following components:      Result Value   Glucose, Bld  100 (*)    All other components within normal limits  CBC - Abnormal; Notable for the following components:   RBC 6.00 (*)    All other components within normal limits    EKG EKG Interpretation  Date/Time:  Saturday May 18 2021 14:00:21 EST Ventricular Rate:  56 PR Interval:  144 QRS Duration: 100 QT Interval:  411 QTC Calculation: 397 R Axis:   16 Text Interpretation: Sinus rhythm Anterior infarct, old Since prior ECG, rhythm is now sinus Confirmed by Gareth Morgan [phone]) on 05/18/2021 2:27:47 PM  Radiology DG Chest Port 1 View  Result Date: 05/18/2021 CLINICAL DATA:  Weakness and dizziness. EXAM: PORTABLE CHEST 1 VIEW COMPARISON:  None. FINDINGS: Normal heart, mediastinum and hila. Clear lungs.  No pleural effusion or pneumothorax. Skeletal structures  are grossly intact. IMPRESSION: No active disease. Electronically Signed   By: Lajean Manes M.D.   On: 05/18/2021 13:04    Procedures .Cardioversion  Date/Time: 05/18/2021 10:41 PM Performed by: Gareth Morgan, MD Authorized by: Gareth Morgan, MD   Consent:    Consent obtained:  Written   Consent given by:  Patient   Risks discussed:  Cutaneous burn, death, induced arrhythmia and pain   Alternatives discussed:  Rate-control medication Pre-procedure details:    Cardioversion basis:  Elective   Rhythm:  Atrial fibrillation   Electrode placement:  Anterior-posterior Attempt one:    Cardioversion mode:  Synchronous   Shock (Joules):  120   Shock outcome:  Conversion to normal sinus rhythm Post-procedure details:    Patient status:  Awake   Patient tolerance of procedure:  Tolerated well, no immediate complications Comments:     90mg  (given 70mg , followed shortly by 20mg )   .Sedation  Date/Time: 05/18/2021 10:43 PM Performed by: Gareth Morgan, MD Authorized by: Gareth Morgan, MD   Consent:    Consent obtained:  Written   Consent given by:  Patient   Risks discussed:  Allergic reaction, dysrhythmia, inadequate sedation, nausea, respiratory compromise necessitating ventilatory assistance and intubation, prolonged sedation necessitating reversal, prolonged hypoxia resulting in organ damage and vomiting   Alternatives discussed:  Analgesia without sedation Universal protocol:    Immediately prior to procedure, a time out was called: yes     Patient identity confirmed:  Verbally with patient Indications:    Procedure performed:  Cardioversion Pre-sedation assessment:    Time since last food or drink:  2   ASA classification: class 1 - normal, healthy patient     Mouth opening:  3 or more finger widths   Mallampati score:  I - soft palate, uvula, fauces, pillars visible   Neck mobility: normal     Pre-sedation assessments completed and reviewed: airway patency,  cardiovascular function, hydration status, mental status, nausea/vomiting and pain level   Immediate pre-procedure details:    Reassessment: Patient reassessed immediately prior to procedure     Reviewed: vital signs, relevant labs/tests and NPO status     Verified: bag valve mask available, emergency equipment available, intubation equipment available and IV patency confirmed   Procedure details (see MAR for exact dosages):    Preoxygenation:  Nasal cannula   Sedation:  Propofol   Intended level of sedation: deep   Intra-procedure events: none     Total Provider sedation time (minutes):  11 Post-procedure details:    Attendance: Constant attendance by certified staff until patient recovered     Recovery: Patient returned to pre-procedure baseline     Post-sedation assessments completed and reviewed: airway patency,  cardiovascular function, hydration status, mental status, nausea/vomiting and respiratory function     Patient is stable for discharge or admission: yes     Procedure completion:  Tolerated well, no immediate complications    Medications Ordered in ED Medications  propofol (DIPRIVAN) 10 mg/mL bolus/IV push 91.6 mg (90 mg Intravenous Given 05/18/21 1355)    ED Course/ Medical Decision Making/ A&P                           Medical Decision Making Amount and/or Complexity of Data Reviewed Labs: ordered. Radiology: ordered.   41 year old male with a history of paroxysmal atrial fibrillation who was seen yesterday by the cardiologist with episode of atrial fibrillation that began at 2 AM yesterday morning and was started on Xarelto and metoprolol, presents with continued atrial fibrillation symptoms and desire for cardioversion.  Presents with atrial fibrillation, otherwise hemodynamically stable.  He believes his symptoms were brought on by taking a supplement, and has had this before.  In this setting, have low suspicion for pulmonary embolus.  Labs show no significant  anemia or electrolyte abnormalities.  Labs and imaging were personally reviewed and interpreted by me and show no significant abnormalities.  No signs of pneumonia or congestive heart failure.  Discussed options of treatment with patient.  Given his symptoms did begin within 48 hours, and he was initiated on anticoagulation within 12 hours, feel it is reasonable to pursue emergency department cardioversion as per his request.  Cardioverted under propofol sedation (90mg ) to sinus rhythm. Observed with return to baseline, no return of afib. Recommend follow up as scheduled Monday, continuing xarelto (CHADSVASc 0)  and metoprolol. Patient discharged in stable condition with understanding of reasons to return.          Final Clinical Impression(s) / ED Diagnoses Final diagnoses:  Paroxysmal atrial fibrillation [facility])    Rx / DC Orders ED Discharge Orders     None         Gareth Morgan, MD 05/18/21 2252

## 2021-05-18 NOTE — Discharge Instructions (Addendum)
It was a pleasure caring for you today! Continue taking the metoprolol and xarelto and follow up with the atrial fibrillation clinic.

## 2021-05-18 NOTE — ED Notes (Signed)
Pt says he is ready for d/c whenever MD put d/c paperwork in.  His wife is going to come pick him up.

## 2021-05-18 NOTE — ED Triage Notes (Signed)
Patient here POV from Home with A.Fib.  Patient endorses Weakness since Thursday after taking Supplement at 0200.  History of A. Fib. Patient was at Atrial Fibrillation Clinic yesterday and given Metoprolol and Xarelto for Symptoms.  Endorses Weakness and Dizziness. No CP. Mild SOB.  NAD Noted during Triage. A&Ox4. GCS 15. Ambulatory.

## 2021-05-18 NOTE — ED Notes (Signed)
RT in room for cardioversion.

## 2021-05-18 NOTE — ED Notes (Signed)
Xray at bedside to do portable chest xray 

## 2021-05-20 ENCOUNTER — Encounter (HOSPITAL_COMMUNITY): Payer: Self-pay | Admitting: Physician Assistant

## 2021-05-20 ENCOUNTER — Ambulatory Visit (HOSPITAL_COMMUNITY)
Admission: RE | Admit: 2021-05-20 | Discharge: 2021-05-20 | Disposition: A | Discharge status: Home / Self Care | Payer: BC Managed Care – PPO | Source: Ambulatory Visit | Attending: Nurse Practitioner | Admitting: Nurse Practitioner

## 2021-05-20 ENCOUNTER — Other Ambulatory Visit: Payer: Self-pay

## 2021-05-20 VITALS — BP 122/72 | HR 61 | Ht 74.0 in | Wt 203.0 lb

## 2021-05-20 DIAGNOSIS — Z79899 Other long term (current) drug therapy: Secondary | ICD-10-CM | POA: Insufficient documentation

## 2021-05-20 DIAGNOSIS — I498 Other specified cardiac arrhythmias: Secondary | ICD-10-CM | POA: Diagnosis not present

## 2021-05-20 DIAGNOSIS — F17210 Nicotine dependence, cigarettes, uncomplicated: Secondary | ICD-10-CM | POA: Diagnosis not present

## 2021-05-20 DIAGNOSIS — F1090 Alcohol use, unspecified, uncomplicated: Secondary | ICD-10-CM | POA: Diagnosis not present

## 2021-05-20 DIAGNOSIS — I480 Paroxysmal atrial fibrillation: Secondary | ICD-10-CM | POA: Insufficient documentation

## 2021-05-20 DIAGNOSIS — Z7901 Long term (current) use of anticoagulants: Secondary | ICD-10-CM | POA: Diagnosis not present

## 2021-05-20 NOTE — Progress Notes (Signed)
Primary Care Physician: Patient, No Pcp Per (Inactive) Referring Physician: [facility] ER visit    David Swanson is a 41 y.o. male with a h/o afib dx 6 yrs ago in 2017. He had a successful CV at at that time. He has only noted a couple of short episodes that have occurred since then. He had an echo at the time and was told it was normal. Usually if  he goes back to sleep, for a mild afib episode, he will wake up in [place]. On 7/2, he woke up with heart fluttering around 3 am. When he woke up to go to work, that am,  his heart was still fluttering and he  felt lightheaded so he decided to go to ER. He felt that dehydration from  the day before and also drinking an energy drink and 2 Cokes contributed. He does not believe he drank alcohol that night. He does not usually drink caffeine containing  drinks, other than 2 cups of coffee that am. He smokes 2-3 cigarettes a day and 3-4 times a week, will drink up to 3 alcoholic drinks a day. States he does not snore. He does go to the gym on a regular basis. 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   He had successful cardioversion in the ER. He is not on daily rate control. He is not anticoagulated with a CHA2DS2VASc score of 0. He remains in SR today.   Follow up in the AF clinic 05/17/21. Patient woke at 2am with tachypalpitations and "heaviness" in his chest. He does report taking several supplements prior to the onset of his symptoms as he was planning on starting a workout program. He does feel "clammy".   Follow up in the AF clinic 05/20/21. He presented to the ED on 05/18/21 and underwent DCCV at that time. He states that he felt instantly better after DCCV. He denies any bleeding issues on anticoagulation.   Today, he denies symptoms of palpitations, shortness of breath, orthopnea, PND, lower extremity edema, dizziness, presyncope, syncope, or neurologic sequela. The patient is tolerating medications without difficulties and is otherwise without  complaint today.   Past Medical History:  Diagnosis Date   A-fib [facility])    Morton neuroma [id number]   LEFT 3 RD INTERSPACE   Past Surgical History:  Procedure Laterality Date   ANKLE ARTHROSCOPY Bilateral UNKNOWN   TYPE OF SURGERY B/L UNKNOWN    Current Outpatient Medications  Medication Sig Dispense Refill   metoprolol tartrate (LOPRESSOR) 25 MG tablet Take 1 tablet (25 mg total) by mouth 2 (two) times daily. 60 tablet 3   rivaroxaban (XARELTO) 20 MG TABS tablet Take 1 tablet (20 mg total) by mouth daily with supper. 30 tablet 3   Current Facility-Administered Medications  Medication Dose Route Frequency Provider Last Rate Last Admin   triamcinolone acetonide (KENALOG) 10 MG/ML injection 10 mg  10 mg Other Once Regal, Norman S, DPM        No Known Allergies  Social History   Socioeconomic History   Marital status: Single    Spouse name: Not on file   Number of children: Not on file   Years of education: Not on file   Highest education level: Not on file  Occupational History   Not on file  Tobacco Use   Smoking status: Every Day    Packs/day: 0.25    Years: 5.00    Pack years: 1.25  Types: Cigarettes    Start date: 12/23/2007   Smokeless tobacco: Never   Tobacco comments:    Half pack daily 05/17/21  Vaping Use   Vaping Use: Never used  Substance and Sexual Activity   Alcohol use: Not Currently    Alcohol/week: 1.0 - 2.0 standard drink    Types: 1 - 2 Glasses of wine per week    Comment: 05/17/21   Drug use: Not Currently    Types: Marijuana   Sexual activity: Yes  Other Topics Concern   Not on file  Social History Narrative   Not on file   Social Determinants of Health   Financial Resource Strain: Not on file  Food Insecurity: Not on file  Transportation Needs: Not on file  Physical Activity: Not on file  Stress: Not on file  Social Connections: Not on file  Intimate Partner Violence: Not on file    Family History  Problem Relation Age of  Onset   Cancer Mother     ROS- All systems are reviewed and negative except as per the HPI above  Physical Exam: Vitals:   05/20/21 1409  BP: 122/72  Pulse: 61  Weight: 92.1 kg  Height: 6\' 2"  (1.88 m)     Wt Readings from Last 3 Encounters:  05/20/21 92.1 kg  05/18/21 91.6 kg  05/17/21 91.6 kg    Labs: Lab Results  Component Value Date   NA 135 05/18/2021   K 4.9 05/18/2021   CL 105 05/18/2021   CO2 23 05/18/2021   GLUCOSE 100 (H) 05/18/2021   BUN 20 05/18/2021   CREATININE 0.93 05/18/2021   CALCIUM 9.9 05/18/2021   MG 2.0 10/06/2020   No results found for: INR No results found for: CHOL, HDL, LDLCALC, TRIG  GEN- The patient is a well appearing male, alert and oriented x 3 today.   HEENT-head normocephalic, atraumatic, sclera clear, conjunctiva pink, hearing intact, trachea midline. Lungs- Clear to ausculation bilaterally, normal work of breathing Heart- Regular rate and rhythm, no murmurs, rubs or gallops  GI- soft, NT, ND, + BS Extremities- no clubbing, cyanosis, or edema MS- no significant deformity or atrophy Skin- no rash or lesion Psych- euthymic mood, full affect Neuro- strength and sensation are intact   EKG- SR Vent. rate 61 BPM PR interval 148 ms QRS duration 108 ms QT/QTcB 426/428 ms  Echo- 11/05/20  1. Left ventricular ejection fraction, by estimation, is 60 to 65%. The left ventricle has normal function. The left ventricle has no regional wall motion abnormalities. Left ventricular diastolic parameters were normal.   2. Right ventricular systolic function is normal. The right ventricular size is normal.   3. The mitral valve is normal in structure. Trivial mitral valve  regurgitation.   4. The aortic valve is normal in structure. Aortic valve regurgitation is not visualized.   5. The inferior vena cava is normal in size with greater than 50% respiratory variability, suggesting right atrial pressure of 3 mmHg.   Assessment and Plan:  1.  Paroxysmal afib   S/p DCCV on 05/18/21 Patient back in SR. Continue Lopressor 25 mg BID Continue Xarelto 20 mg daily for one month post DCCV. He has now had 3 DCCVs in the ED. We discussed long term rhythm control. He is interested in ablation, will refer to EP to discuss.    2. CHA2DS2VASc  score of 0  Continue Xarelto 20 mg daily, will not need this long term with low CV score.  Follow up with EP to discuss ablation.    [facility] [address] [place],  [address] [phone]

## 2021-05-23 ENCOUNTER — Ambulatory Visit (HOSPITAL_COMMUNITY): Payer: BC Managed Care – PPO | Admitting: Nurse Practitioner

## 2021-05-27 ENCOUNTER — Ambulatory Visit (HOSPITAL_COMMUNITY): Payer: BC Managed Care – PPO | Admitting: Physician Assistant

## 2021-06-10 ENCOUNTER — Encounter: Payer: Self-pay | Admitting: Cardiology

## 2021-06-10 ENCOUNTER — Encounter: Payer: Self-pay | Admitting: *Deleted

## 2021-06-10 ENCOUNTER — Ambulatory Visit: Payer: BC Managed Care – PPO | Admitting: Cardiology

## 2021-06-10 ENCOUNTER — Other Ambulatory Visit: Payer: Self-pay

## 2021-06-10 VITALS — BP 108/84 | HR 60 | Ht 74.0 in | Wt 202.8 lb

## 2021-06-10 DIAGNOSIS — Z01818 Encounter for other preprocedural examination: Secondary | ICD-10-CM

## 2021-06-10 DIAGNOSIS — I480 Paroxysmal atrial fibrillation: Secondary | ICD-10-CM

## 2021-06-10 DIAGNOSIS — Z01812 Encounter for preprocedural laboratory examination: Secondary | ICD-10-CM

## 2021-06-10 MED ORDER — DILTIAZEM HCL ER COATED BEADS 180 MG PO CP24: 180.0000 mg | ORAL_CAPSULE | Freq: Every day | ORAL | 3 refills | Status: DC

## 2021-06-10 NOTE — Progress Notes (Signed)
? ?Electrophysiology Office Note ? ? ?Date:  06/10/2021  ? ?ID:  David Swanson, DOB 06-Jul-1980, MRN [id number] ? ?PCP:  Patient, No Pcp Per (Inactive)  ?Cardiologist:   ?Primary Electrophysiologist:  David Hessel Meredith Leeds, MD   ? ?Chief Complaint: AF ?  ?History of Present Illness: ?David Swanson is a 41 y.o. male who is being seen today for the evaluation of AF at the request of Swanson, David R, PA. Presenting today for electrophysiology evaluation. ? ?With a history significant for atrial fibrillation.  He was diagnosed in 2017.  He had successful cardioversion at the time.  He had then had a couple short episodes of atrial fibrillation.  On 10/06/2020 he woke up with fluttering around 3 AM.  He went to work that morning but his heart was still fluttering and he went to the emergency room.  He had a successful cardioversion.  He had follow-up in the atrial fibrillation clinic on 05/17/2021.  He woke up at 2 AM with palpitations.  He was taking several supplements prior to the onset of the symptoms. ? ?He presented to the emergency room 05/18/2021 in atrial fibrillation requiring cardioversion. ? ?Today, he denies symptoms of palpitations, chest pain, shortness of breath, orthopnea, PND, lower extremity edema, claudication, dizziness, presyncope, syncope, bleeding, or neurologic sequela. The patient is tolerating medications without difficulties.  He is fortunately not had any further episodes of atrial fibrillation since his episode in February.  At this point, he would prefer a rhythm control strategy and would prefer to avoid medications. ? ? ?Past Medical History:  ?Diagnosis Date  ? A-fib ([place])   ? 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 LEFT 3 RD INTERSPACE  ? ?Past Surgical History:  ?Procedure Laterality Date  ? ANKLE ARTHROSCOPY Bilateral UNKNOWN  ? TYPE OF SURGERY B/L UNKNOWN  ? ? ? ?Current Outpatient Medications  ?Medication Sig Dispense Refill  ? diltiazem (CARDIZEM CD) 180 MG 24 hr capsule Take 1 capsule (180 mg  total) by mouth daily. 30 capsule 3  ? rivaroxaban (XARELTO) 20 MG TABS tablet Take 1 tablet (20 mg total) by mouth daily with supper. 30 tablet 3  ? ?Current Facility-Administered Medications  ?Medication Dose Route Frequency Provider Last Rate Last Admin  ? triamcinolone acetonide (KENALOG) 10 MG/ML injection 10 mg  10 mg Other Once David Swanson, DPM      ? ? ?Allergies:   Patient has no known allergies.  ? ?Social History:  The patient  reports that he has been smoking cigarettes. He started smoking about 13 years ago. He has a 0.50 pack-year smoking history. He has never been exposed to tobacco smoke. He has never used smokeless tobacco. He reports that he does not currently use alcohol after a past usage of about 1.0 - 2.0 standard drink per week. He reports that he does not currently use drugs after having used the following drugs: Marijuana.  ? ?Family History:  The patient's family history includes Atrial fibrillation in his maternal grandmother; Cancer in his father and mother.  ? ? ?ROS:  Please see the history of present illness.   Otherwise, review of systems is positive for none.   All other systems are reviewed and negative.  ? ? ?PHYSICAL EXAM: ?VS:  BP 108/84   Pulse 60   Ht 6\' 2"  (1.88 m)   Wt 202 lb 12.8 oz (92 kg)   SpO2 97%   BMI 26.04 kg/m?  , BMI Body mass index is 26.04 kg/m?. ?GEN: Well  nourished, well developed, in no acute distress  ?HEENT: normal  ?Neck: no JVD, carotid bruits, or masses ?Cardiac: RRR; no murmurs, rubs, or gallops,no edema  ?Respiratory:  clear to auscultation bilaterally, normal work of breathing ?GI: soft, nontender, nondistended, + BS ?MS: no deformity or atrophy  ?Skin: warm and dry ?Neuro:  Strength and sensation are intact ?Psych: euthymic mood, full affect ? ?EKG:  EKG is not ordered today. ?Personal review of the ekg ordered 05/17/21 shows atrial fibrillation ? ?Recent Labs: ?10/06/2020: Magnesium 2.0; TSH 1.248 ?05/18/2021: BUN 20; Creatinine, Ser 0.93;  Hemoglobin 16.9; Platelets 259; Potassium 4.9; Sodium 135  ? ? ?Lipid Panel  ?No results found for: CHOL, TRIG, HDL, CHOLHDL, VLDL, LDLCALC, LDLDIRECT ? ? ?Wt Readings from Last 3 Encounters:  ?06/10/21 202 lb 12.8 oz (92 kg)  ?05/20/21 203 lb (92.1 kg)  ?05/18/21 201 lb 15.1 oz (91.6 kg)  ?  ? ? ?Other studies Reviewed: ?Additional studies/ records that were reviewed today include: TTE 11/05/20  ?Review of the above records today demonstrates:  ? 1. Left ventricular ejection fraction, by estimation, is 60 to 65%. The  ?left ventricle has normal function. The left ventricle has no regional  ?wall motion abnormalities. Left ventricular diastolic parameters were  ?normal.  ? 2. Right ventricular systolic function is normal. The right ventricular  ?size is normal.  ? 3. The mitral valve is normal in structure. Trivial mitral valve  ?regurgitation.  ? 4. The aortic valve is normal in structure. Aortic valve regurgitation is  ?not visualized.  ? 5. The inferior vena cava is normal in size with greater than 50%  ?respiratory variability, suggesting right atrial pressure of 3 mmHg. ? ? ?ASSESSMENT AND PLAN: ? ?1.  Paroxysmal atrial fibrillation: Currently on metoprolol 25 mg twice daily, Xarelto 20 mg daily.  Has had multiple cardioversions in the past.  He is interested in ablation.  He would prefer to avoid medication management for rhythm control.  He has some fatigue on his metoprolol.  We David Swanson stop this today and start 180 mg of diltiazem.   ? ?Risk, benefits, and alternatives to EP study and radiofrequency ablation for afib were also discussed in detail today. These risks include but are not limited to stroke, bleeding, vascular damage, tamponade, perforation, damage to the esophagus, lungs, and other structures, pulmonary vein stenosis, worsening renal function, and death. The patient understands these risk and wishes to proceed.  We David Swanson therefore proceed with catheter ablation at the next available time.  Carto,  ICE, anesthesia are requested for the procedure.  David Swanson also obtain CT PV protocol prior to the procedure to exclude LAA thrombus and further evaluate atrial anatomy.  ? ? ?Current medicines are reviewed at length with the patient today.   ?The patient does not have concerns regarding his medicines.  The following changes were made today: Stop metoprolol, start diltiazem ? ?Labs/ tests ordered today include:  ?Orders Placed This Encounter  ?Procedures  ? CT CARDIAC MORPH/PULM VEIN W/CM&W/O CA SCORE  ? Basic metabolic panel  ? CBC  ? ? ? ?Disposition:   FU with David Swanson 3 months ? ?Signed, ?David Godsil Meredith Leeds, MD  ?06/10/2021 3:51 PM    ? ?[facility] ?[address] ?[address] ?[place] [address] ?[phone] (office) ?[phone] (fax) ? ?

## 2021-06-10 NOTE — Patient Instructions (Signed)
Medication Instructions:  ?Your physician has recommended you make the following change in your medication:  ?STOP Metoprolol ?START Diltiazem 180 mg once daily ? ?*If you need a refill on your cardiac medications before your next appointment, please call your pharmacy* ? ? ?Lab Work: ?Pre procedure labs 06/10/21:  BMP & CBC ? ?If you have labs (blood work) drawn today and your tests are completely normal, you will receive your results only by: ?MyChart Message (if you have MyChart) OR ?A paper copy in the mail ?If you have any lab test that is abnormal or we need to change your treatment, we will call you to review the results. ? ? ?Testing/Procedures: ?Your physician has requested that you have cardiac CT within 7 days PRIOR to your ablation. Cardiac computed tomography (CT) is a painless test that uses an x-ray machine to take clear, detailed pictures of your heart.  Please follow instruction below located under "other instructions". ?You will get a call from our office to schedule the date for this test. ? ?Your physician has recommended that you have an ablation. Catheter ablation is a medical procedure used to treat some cardiac arrhythmias (irregular heartbeats). During catheter ablation, a long, thin, flexible tube is put into a blood vessel in your groin (upper thigh), or neck. This tube is called an ablation catheter. It is then guided to your heart through the blood vessel. Radio frequency waves destroy small areas of heart tissue where abnormal heartbeats may cause an arrhythmia to start. Please follow instruction below located under "other instructions". ? ? ?Follow-Up: ?At [facility], you and your health needs are our priority.  As part of our continuing mission to provide you with exceptional heart care, we have created designated Provider Care Teams.  These Care Teams include your primary Cardiologist (physician) and Advanced Practice Providers (APPs -  Physician Assistants and Nurse  Practitioners) who all work together to provide you with the care you need, when you need it. ? ?We recommend signing up for the patient portal called "MyChart".  Sign up information is provided on this After Visit Summary.  MyChart is used to connect with patients for Virtual Visits (Telemedicine).  Patients are able to view lab/test results, encounter notes, upcoming appointments, etc.  Non-urgent messages can be sent to your provider as well.   ?To learn more about what you can do with MyChart, go to [email or web address].   ? ?Your next appointment:   ?1 month(s) after your ablation ? ?The format for your next appointment:   ?In Person ? ?Provider:   ?AFib clinic ? ? ?Thank you for choosing [facility]!! ? ? ?Trinidad Curet, RN ?[phone] ? ? ? ?Other Instructions ? ?Cardiac Ablation ?Cardiac ablation is a procedure to destroy (ablate) some heart tissue that is sending bad signals. These bad signals cause problems in heart rhythm. ?The heart has many areas that make these signals. If there are problems in these areas, they can make the heart beat in a way that is not normal. Destroying some tissues can help make the heart rhythm normal. ?Tell your doctor about: ?Any allergies you have. ?All medicines you are taking. These include vitamins, herbs, eye drops, creams, and over-the-counter medicines. ?Any problems you or family members have had with medicines that make you fall asleep (anesthetics). ?Any blood disorders you have. ?Any surgeries you have had. ?Any medical conditions you have, such as kidney failure. ?Whether you are pregnant or may be pregnant. ?What are the risks? ?This is  a safe procedure. But problems may occur, including: ?Infection. ?Bruising and bleeding. ?Bleeding into the chest. ?Stroke or blood clots. ?Damage to nearby areas of your body. ?Allergies to medicines or dyes. ?The need for a pacemaker if the normal system is damaged. ?Failure of the procedure to treat the problem. ?What  happens before the procedure? ?Medicines ?Ask your doctor about: ?Changing or stopping your normal medicines. This is important. ?Taking aspirin and ibuprofen. Do not take these medicines unless your doctor tells you to take them. ?Taking other medicines, vitamins, herbs, and supplements. ?General instructions ?Follow instructions from your doctor about what you cannot eat or drink. ?Plan to have someone take you home from the hospital or clinic. ?If you will be going home right after the procedure, plan to have someone with you for 24 hours. ?Ask your doctor what steps will be taken to prevent infection. ?What happens during the procedure? ? ?An IV tube will be put into one of your veins. ?You will be given a medicine to help you relax. ?The skin on your neck or groin will be numbed. ?A cut (incision) will be made in your neck or groin. A needle will be put through your cut and into a large vein. ?A tube (catheter) will be put into the needle. The tube will be moved to your heart. ?Dye may be put through the tube. This helps your doctor see your heart. ?Small devices (electrodes) on the tube will send out signals. ?A type of energy will be used to destroy some heart tissue. ?The tube will be taken out. ?Pressure will be held on your cut. This helps stop bleeding. ?A bandage will be put over your cut. ?The exact procedure may vary among doctors and hospitals. ?What happens after the procedure? ?You will be watched until you leave the hospital or clinic. This includes checking your heart rate, breathing rate, oxygen, and blood pressure. ?Your cut will be watched for bleeding. You will need to lie still for a few hours. ?Do not drive for 24 hours or as long as your doctor tells you. ?Summary ?Cardiac ablation is a procedure to destroy some heart tissue. This is done to treat heart rhythm problems. ?Tell your doctor about any medical conditions you may have. Tell him or her about all medicines you are taking to treat  them. ?This is a safe procedure. But problems may occur. These include infection, bruising, bleeding, and damage to nearby areas of your body. ?Follow what your doctor tells you about food and drink. You may also be told to change or stop some of your medicines. ?After the procedure, do not drive for 24 hours or as long as your doctor tells you. ?This information is not intended to replace advice given to you by your health care provider. Make sure you discuss any questions you have with your health care provider. ?Document Revised: 02/24/2019 Document Reviewed: 02/24/2019 ?Elsevier Patient Education ? 2022 [place]. ? ? ? ? ?

## 2021-06-10 NOTE — H&P (View-Only) (Signed)
? ?Electrophysiology Office Note ? ? ?Date:  06/10/2021  ? ?ID:  David Swanson, DOB [birth year], MRN [id number] ? ?PCP:  Patient, No Pcp Per (Inactive)  ?Cardiologist:   ?Primary Electrophysiologist:  Quentin Strebel Meredith Leeds, MD   ? ?Chief Complaint: AF ?  ?History of Present Illness: ?David Swanson is a 41 y.o. male who is being seen today for the evaluation of AF at the request of Fenton, Clint R, PA. Presenting today for electrophysiology evaluation. ? ?With a history significant for atrial fibrillation.  He was diagnosed in 2017.  He had successful cardioversion at the time.  He had then had a couple short episodes of atrial fibrillation.  On 10/06/2020 he woke up with fluttering around 3 AM.  He went to work that morning but his heart was still fluttering and he went to the emergency room.  He had a successful cardioversion.  He had follow-up in the atrial fibrillation clinic on 05/17/2021.  He woke up at 2 AM with palpitations.  He was taking several supplements prior to the onset of the symptoms. ? ?He presented to the emergency room 05/18/2021 in atrial fibrillation requiring cardioversion. ? ?Today, he denies symptoms of palpitations, chest pain, shortness of breath, orthopnea, PND, lower extremity edema, claudication, dizziness, presyncope, syncope, bleeding, or neurologic sequela. The patient is tolerating medications without difficulties.  He is fortunately not had any further episodes of atrial fibrillation since his episode in February.  At this point, he would prefer a rhythm control strategy and would prefer to avoid medications. ? ? ?Past Medical History:  ?Diagnosis Date  ? A-fib ([place])   ? 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 LEFT 3 RD INTERSPACE  ? ?Past Surgical History:  ?Procedure Laterality Date  ? ANKLE ARTHROSCOPY Bilateral UNKNOWN  ? TYPE OF SURGERY B/L UNKNOWN  ? ? ? ?Current Outpatient Medications  ?Medication Sig Dispense Refill  ? diltiazem (CARDIZEM CD) 180 MG 24 hr capsule Take 1 capsule (180 mg  total) by mouth daily. 30 capsule 3  ? rivaroxaban (XARELTO) 20 MG TABS tablet Take 1 tablet (20 mg total) by mouth daily with supper. 30 tablet 3  ? ?Current Facility-Administered Medications  ?Medication Dose Route Frequency Provider Last Rate Last Admin  ? triamcinolone acetonide (KENALOG) 10 MG/ML injection 10 mg  10 mg Other Once Wallene Huh, DPM      ? ? ?Allergies:   Patient has no known allergies.  ? ?Social History:  The patient  reports that he has been smoking cigarettes. He started smoking about 13 years ago. He has a 0.50 pack-year smoking history. He has never been exposed to tobacco smoke. He has never used smokeless tobacco. He reports that he does not currently use alcohol after a past usage of about 1.0 - 2.0 standard drink per week. He reports that he does not currently use drugs after having used the following drugs: Marijuana.  ? ?Family History:  The patient's family history includes Atrial fibrillation in his maternal grandmother; Cancer in his father and mother.  ? ? ?ROS:  Please see the history of present illness.   Otherwise, review of systems is positive for none.   All other systems are reviewed and negative.  ? ? ?PHYSICAL EXAM: ?VS:  BP 108/84   Pulse 60   Ht 6\' 2"  (1.88 m)   Wt 202 lb 12.8 oz (92 kg)   SpO2 97%   BMI 26.04 kg/m?  , BMI Body mass index is 26.04 kg/m?. ?GEN: Well  nourished, well developed, in no acute distress  ?HEENT: normal  ?Neck: no JVD, carotid bruits, or masses ?Cardiac: RRR; no murmurs, rubs, or gallops,no edema  ?Respiratory:  clear to auscultation bilaterally, normal work of breathing ?GI: soft, nontender, nondistended, + BS ?MS: no deformity or atrophy  ?Skin: warm and dry ?Neuro:  Strength and sensation are intact ?Psych: euthymic mood, full affect ? ?EKG:  EKG is not ordered today. ?Personal review of the ekg ordered 05/17/21 shows atrial fibrillation ? ?Recent Labs: ?10/06/2020: Magnesium 2.0; TSH 1.248 ?05/18/2021: BUN 20; Creatinine, Ser 0.93;  Hemoglobin 16.9; Platelets 259; Potassium 4.9; Sodium 135  ? ? ?Lipid Panel  ?No results found for: CHOL, TRIG, HDL, CHOLHDL, VLDL, LDLCALC, LDLDIRECT ? ? ?Wt Readings from Last 3 Encounters:  ?06/10/21 202 lb 12.8 oz (92 kg)  ?05/20/21 203 lb (92.1 kg)  ?05/18/21 201 lb 15.1 oz (91.6 kg)  ?  ? ? ?Other studies Reviewed: ?Additional studies/ records that were reviewed today include: TTE 11/05/20  ?Review of the above records today demonstrates:  ? 1. Left ventricular ejection fraction, by estimation, is 60 to 65%. The  ?left ventricle has normal function. The left ventricle has no regional  ?wall motion abnormalities. Left ventricular diastolic parameters were  ?normal.  ? 2. Right ventricular systolic function is normal. The right ventricular  ?size is normal.  ? 3. The mitral valve is normal in structure. Trivial mitral valve  ?regurgitation.  ? 4. The aortic valve is normal in structure. Aortic valve regurgitation is  ?not visualized.  ? 5. The inferior vena cava is normal in size with greater than 50%  ?respiratory variability, suggesting right atrial pressure of 3 mmHg. ? ? ?ASSESSMENT AND PLAN: ? ?1.  Paroxysmal atrial fibrillation: Currently on metoprolol 25 mg twice daily, Xarelto 20 mg daily.  Has had multiple cardioversions in the past.  He is interested in ablation.  He would prefer to avoid medication management for rhythm control.  He has some fatigue on his metoprolol.  We Savannaha Stonerock stop this today and start 180 mg of diltiazem.   ? ?Risk, benefits, and alternatives to EP study and radiofrequency ablation for afib were also discussed in detail today. These risks include but are not limited to stroke, bleeding, vascular damage, tamponade, perforation, damage to the esophagus, lungs, and other structures, pulmonary vein stenosis, worsening renal function, and death. The patient understands these risk and wishes to proceed.  We Chayanne Filippi therefore proceed with catheter ablation at the next available time.  Carto,  ICE, anesthesia are requested for the procedure.  Kemonie Cutillo also obtain CT PV protocol prior to the procedure to exclude LAA thrombus and further evaluate atrial anatomy.  ? ? ?Current medicines are reviewed at length with the patient today.   ?The patient does not have concerns regarding his medicines.  The following changes were made today: Stop metoprolol, start diltiazem ? ?Labs/ tests ordered today include:  ?Orders Placed This Encounter  ?Procedures  ? CT CARDIAC MORPH/PULM VEIN W/CM&W/O CA SCORE  ? Basic metabolic panel  ? CBC  ? ? ? ?Disposition:   FU with Jann Ra 3 months ? ?Signed, ?Lon Klippel Meredith Leeds, MD  ?06/10/2021 3:51 PM    ? ?[facility] ?[address] ?[address] ?[place] [address] ?[phone] ([phone] ?[phone] ? ?

## 2021-06-11 LAB — CBC
Hematocrit: 45.4 % (ref 37.5–51.0)
Hemoglobin: 15.4 g/dL (ref 13.0–17.7)
MCH: 28.7 pg (ref 26.6–33.0)
MCHC: 33.9 g/dL (ref 31.5–35.7)
MCV: 85 fL (ref 79–97)
Platelets: 224 10*3/uL (ref 150–450)
RBC: 5.36 x10E6/uL (ref 4.14–5.80)
RDW: 13.5 % (ref 11.6–15.4)
WBC: 7.3 10*3/uL (ref 3.4–10.8)

## 2021-06-11 LAB — BASIC METABOLIC PANEL
BUN/Creatinine Ratio: 15 (ref 9–20)
BUN: 16 mg/dL (ref 6–20)
CO2: 24 mmol/L (ref 20–29)
Calcium: 10 mg/dL (ref 8.7–10.2)
Chloride: 98 mmol/L (ref 96–106)
Creatinine, Ser: 1.05 mg/dL (ref 0.76–1.27)
Glucose: 94 mg/dL (ref 70–99)
Potassium: 4.1 mmol/L (ref 3.5–5.2)
Sodium: 137 mmol/L (ref 134–144)
eGFR: 93 mL/min/{1.73_m2} (ref >59)

## 2021-06-12 ENCOUNTER — Telehealth (HOSPITAL_COMMUNITY): Payer: Self-pay | Admitting: *Deleted

## 2021-06-12 NOTE — Telephone Encounter (Signed)
Reaching out to patient to offer assistance regarding upcoming cardiac imaging study; pt verbalizes understanding of appt date/time, parking situation and where to check in, pre-test NPO status and verified current allergies; name and call back number provided for further questions should they arise ? ?Larey Brick RN Navigator Cardiac Imaging ?[facility] ?[phone] office ?[phone] cell ? ?Patient aware to arrive at 7:30am for his 8am scan. ?

## 2021-06-13 ENCOUNTER — Ambulatory Visit (HOSPITAL_COMMUNITY)
Admission: RE | Admit: 2021-06-13 | Discharge: 2021-06-13 | Disposition: A | Discharge status: Home / Self Care | Payer: BC Managed Care – PPO | Source: Ambulatory Visit | Attending: Cardiology | Admitting: Cardiology

## 2021-06-13 ENCOUNTER — Other Ambulatory Visit: Payer: Self-pay

## 2021-06-13 DIAGNOSIS — I480 Paroxysmal atrial fibrillation: Secondary | ICD-10-CM | POA: Insufficient documentation

## 2021-06-13 MED ORDER — IOHEXOL 350 MG/ML SOLN
95.0000 mL | Freq: Once | INTRAVENOUS | Status: AC | PRN
  Administered 2021-06-13: 08:00:00 95 mL via INTRAVENOUS

## 2021-06-18 NOTE — Pre-Procedure Instructions (Signed)
Instructed patient on the following items: Arrival time 0630 Nothing to eat or drink after midnight No meds AM of procedure Responsible person to drive you home and stay with you for 24 hrs  Have you missed any doses of anti-coagulant Xarelto- hasn't missed any doses    

## 2021-06-18 NOTE — Anesthesia Preprocedure Evaluation (Addendum)
Anesthesia Evaluation  ?Patient identified by MRN, date of birth, ID band ?Patient awake ? ? ? ?Reviewed: ?Allergy & Precautions, NPO status , Patient's Chart, lab work & pertinent test results ? ?History of Anesthesia Complications ?Negative for: history of anesthetic complications ? ?Airway ?Mallampati: I ? ?TM Distance: >3 FB ?Neck ROM: Full ? ? ? Dental ? ?(+) Dental Advisory Given, Teeth Intact ?  ?Pulmonary ?Current Smoker and Patient abstained from smoking.,  ?  ?breath sounds clear to auscultation ? ? ? ? ? ? Cardiovascular ?negative cardio ROS ? ?+ dysrhythmias (diltiazem) Atrial Fibrillation  ?Rhythm:Irregular Rate:Normal ? ?11/2020 ECHO: EF 60-65%, normal LVF, normal RVF, no significant valvular abnormalities ?  ?Neuro/Psych ?negative neurological ROS ?   ? GI/Hepatic ?Neg liver ROS, GERD  Controlled,  ?Endo/Other  ?negative endocrine ROS ? Renal/GU ?negative Renal ROS  ? ?  ?Musculoskeletal ? ? Abdominal ?  ?Peds ? Hematology ?xarelto   ?Anesthesia Other Findings ? ? Reproductive/Obstetrics ? ?  ? ? ? ? ? ? ? ? ? ? ? ? ? ?  ?  ? ? ? ? ? ? ? ?Anesthesia Physical ?Anesthesia Plan ? ?ASA: 3 ? ?Anesthesia Plan: General  ? ?Post-op Pain Management: Tylenol PO (pre-op)* and Minimal or no pain anticipated  ? ?Induction: Intravenous ? ?PONV Risk Score and Plan: 1 and Ondansetron and Dexamethasone ? ?Airway Management Planned: Oral ETT ? ?Additional Equipment: None ? ?Intra-op Plan:  ? ?Post-operative Plan: Extubation in OR ? ?Informed Consent: I have reviewed the patients History and Physical, chart, labs and discussed the procedure including the risks, benefits and alternatives for the proposed anesthesia with the patient or authorized representative who has indicated his/her understanding and acceptance.  ? ? ? ?Dental advisory given ? ?Plan Discussed with: CRNA and Surgeon ? ?Anesthesia Plan Comments:   ? ? ? ? ? ?Anesthesia Quick Evaluation ? ?

## 2021-06-19 ENCOUNTER — Ambulatory Visit (HOSPITAL_COMMUNITY): Payer: BC Managed Care – PPO | Admitting: Anesthesiology

## 2021-06-19 ENCOUNTER — Encounter (HOSPITAL_COMMUNITY)
Admission: RE | Disposition: A | Discharge status: Home / Self Care | Payer: Self-pay | Source: Home / Self Care | Attending: Cardiology

## 2021-06-19 ENCOUNTER — Other Ambulatory Visit: Payer: Self-pay

## 2021-06-19 ENCOUNTER — Encounter (HOSPITAL_COMMUNITY): Payer: Self-pay | Admitting: Cardiology

## 2021-06-19 ENCOUNTER — Ambulatory Visit (HOSPITAL_COMMUNITY)
Admission: RE | Admit: 2021-06-19 | Discharge: 2021-06-19 | Disposition: A | Discharge status: Home / Self Care | Payer: BC Managed Care – PPO | Attending: Cardiology | Admitting: Cardiology

## 2021-06-19 DIAGNOSIS — Z79899 Other long term (current) drug therapy: Secondary | ICD-10-CM | POA: Insufficient documentation

## 2021-06-19 DIAGNOSIS — Z7901 Long term (current) use of anticoagulants: Secondary | ICD-10-CM | POA: Insufficient documentation

## 2021-06-19 DIAGNOSIS — I480 Paroxysmal atrial fibrillation: Secondary | ICD-10-CM | POA: Insufficient documentation

## 2021-06-19 DIAGNOSIS — F17210 Nicotine dependence, cigarettes, uncomplicated: Secondary | ICD-10-CM | POA: Diagnosis not present

## 2021-06-19 LAB — POCT ACTIVATED CLOTTING TIME
Activated Clotting Time: 407 seconds
Activated Clotting Time: 335 seconds
Activated Clotting Time: 347 seconds

## 2021-06-19 SURGERY — ATRIAL FIBRILLATION ABLATION
Anesthesia: General

## 2021-06-19 MED ORDER — MIDAZOLAM HCL 2 MG/2ML IJ SOLN
INTRAMUSCULAR | Status: DC | PRN
  Administered 2021-06-19: 2 mg via INTRAVENOUS

## 2021-06-19 MED ORDER — HEPARIN SODIUM (PORCINE) 1000 UNIT/ML IJ SOLN
INTRAMUSCULAR | Status: DC | PRN
  Administered 2021-06-19: 1000 [IU] via INTRAVENOUS

## 2021-06-19 MED ORDER — PROPOFOL 10 MG/ML IV BOLUS
INTRAVENOUS | Status: DC | PRN
  Administered 2021-06-19: 150 mg via INTRAVENOUS
  Administered 2021-06-19 (×2): 50 mg via INTRAVENOUS

## 2021-06-19 MED ORDER — ONDANSETRON HCL 4 MG/2ML IJ SOLN: 4.0000 mg | Freq: Four times a day (QID) | INTRAMUSCULAR | Status: DC | PRN

## 2021-06-19 MED ORDER — HEPARIN (PORCINE) IN NACL 1000-0.9 UT/500ML-% IV SOLN
INTRAVENOUS | Status: AC
  Filled 2021-06-19: qty 500

## 2021-06-19 MED ORDER — ACETAMINOPHEN 325 MG PO TABS: 650.0000 mg | ORAL_TABLET | ORAL | Status: DC | PRN

## 2021-06-19 MED ORDER — FENTANYL CITRATE (PF) 100 MCG/2ML IJ SOLN
INTRAMUSCULAR | Status: DC | PRN
  Administered 2021-06-19: 100 ug via INTRAVENOUS

## 2021-06-19 MED ORDER — DOBUTAMINE INFUSION FOR EP/ECHO/NUC (1000 MCG/ML)
INTRAVENOUS | Status: AC
  Filled 2021-06-19: qty 250

## 2021-06-19 MED ORDER — OXYCODONE HCL 5 MG PO TABS: 5.0000 mg | ORAL_TABLET | Freq: Once | ORAL | Status: AC | PRN

## 2021-06-19 MED ORDER — SODIUM CHLORIDE 0.9 % IV SOLN: INTRAVENOUS | Status: DC

## 2021-06-19 MED ORDER — SODIUM CHLORIDE 0.9% FLUSH: 3.0000 mL | INTRAVENOUS | Status: DC | PRN

## 2021-06-19 MED ORDER — ROCURONIUM BROMIDE 10 MG/ML (PF) SYRINGE
PREFILLED_SYRINGE | INTRAVENOUS | Status: DC | PRN
Start: 2021-06-19 — End: 2021-06-19
  Administered 2021-06-19: 20 mg via INTRAVENOUS
  Administered 2021-06-19: 60 mg via INTRAVENOUS
  Administered 2021-06-19: 20 mg via INTRAVENOUS

## 2021-06-19 MED ORDER — PROTAMINE SULFATE 10 MG/ML IV SOLN
INTRAVENOUS | Status: DC | PRN
  Administered 2021-06-19: 40 mg via INTRAVENOUS

## 2021-06-19 MED ORDER — OXYCODONE HCL 5 MG PO TABS
ORAL_TABLET | ORAL | Status: AC
  Administered 2021-06-19: 5 mg via ORAL
  Filled 2021-06-19: qty 1

## 2021-06-19 MED ORDER — DOBUTAMINE INFUSION FOR EP/ECHO/NUC (1000 MCG/ML)
INTRAVENOUS | Status: DC | PRN
  Administered 2021-06-19: 20 ug/kg/min via INTRAVENOUS

## 2021-06-19 MED ORDER — SUGAMMADEX SODIUM 200 MG/2ML IV SOLN
INTRAVENOUS | Status: DC | PRN
  Administered 2021-06-19: 200 mg via INTRAVENOUS

## 2021-06-19 MED ORDER — SODIUM CHLORIDE 0.9 % IV SOLN: 250.0000 mL | INTRAVENOUS | Status: DC | PRN

## 2021-06-19 MED ORDER — HEPARIN SODIUM (PORCINE) 1000 UNIT/ML IJ SOLN
INTRAMUSCULAR | Status: DC | PRN
  Administered 2021-06-19: 1000 [IU] via INTRAVENOUS
  Administered 2021-06-19: 14000 [IU] via INTRAVENOUS
  Administered 2021-06-19: 1000 [IU] via INTRAVENOUS

## 2021-06-19 MED ORDER — HEPARIN SODIUM (PORCINE) 1000 UNIT/ML IJ SOLN
INTRAMUSCULAR | Status: AC
  Filled 2021-06-19: qty 10

## 2021-06-19 MED ORDER — HEPARIN (PORCINE) IN NACL 1000-0.9 UT/500ML-% IV SOLN
INTRAVENOUS | Status: DC | PRN
  Administered 2021-06-19 (×5): 500 mL

## 2021-06-19 MED ORDER — DEXAMETHASONE SODIUM PHOSPHATE 10 MG/ML IJ SOLN
INTRAMUSCULAR | Status: DC | PRN
  Administered 2021-06-19: 10 mg via INTRAVENOUS

## 2021-06-19 MED ORDER — ONDANSETRON HCL 4 MG/2ML IJ SOLN
INTRAMUSCULAR | Status: DC | PRN
  Administered 2021-06-19: 4 mg via INTRAVENOUS

## 2021-06-19 MED ORDER — PHENYLEPHRINE 40 MCG/ML (10ML) SYRINGE FOR IV PUSH (FOR BLOOD PRESSURE SUPPORT)
PREFILLED_SYRINGE | INTRAVENOUS | Status: DC | PRN
  Administered 2021-06-19 (×2): 80 ug via INTRAVENOUS

## 2021-06-19 MED ORDER — ACETAMINOPHEN 500 MG PO TABS
1000.0000 mg | ORAL_TABLET | Freq: Once | ORAL | Status: AC
  Administered 2021-06-19: 1000 mg via ORAL
  Filled 2021-06-19: qty 2

## 2021-06-19 MED ORDER — OXYCODONE HCL 5 MG/5ML PO SOLN
5.0000 mg | Freq: Once | ORAL | Status: AC | PRN
  Filled 2021-06-19: qty 5

## 2021-06-19 MED ORDER — LIDOCAINE 2% (20 MG/ML) 5 ML SYRINGE
INTRAMUSCULAR | Status: DC | PRN
  Administered 2021-06-19: 20 mg via INTRAVENOUS

## 2021-06-19 MED ORDER — PHENYLEPHRINE HCL-NACL 20-0.9 MG/250ML-% IV SOLN
INTRAVENOUS | Status: DC | PRN
  Administered 2021-06-19: 20 ug/min via INTRAVENOUS

## 2021-06-19 SURGICAL SUPPLY — 21 items
BAG SNAP BAND KOVER 36X36 (MISCELLANEOUS) ×1 IMPLANT
BLANKET WARM UNDERBOD FULL ACC (MISCELLANEOUS) ×3 IMPLANT
CATH OCTARAY 2.0 F 3-3-3-3-3 (CATHETERS) ×1 IMPLANT
CATH S CIRCA THERM PROBE 10F (CATHETERS) ×1 IMPLANT
CATH SMTCH THERMOCOOL SF DF (CATHETERS) ×1 IMPLANT
CATH SOUNDSTAR ECO 8FR (CATHETERS) ×1 IMPLANT
CATH WEB BI DIR CSDF CRV REPRO (CATHETERS) ×1 IMPLANT
CLOSURE PERCLOSE PROSTYLE (VASCULAR PRODUCTS) ×4 IMPLANT
COVER SWIFTLINK CONNECTOR (BAG) ×3 IMPLANT
KIT VERSACROSS STEERABLE D1 (CATHETERS) ×1 IMPLANT
MAT PREVALON FULL STRYKER (MISCELLANEOUS) ×1 IMPLANT
PACK EP LATEX FREE (CUSTOM PROCEDURE TRAY) ×2
PACK EP LF (CUSTOM PROCEDURE TRAY) ×2 IMPLANT
PAD DEFIB RADIO PHYSIO CONN (PAD) ×3 IMPLANT
PATCH CARTO3 (PAD) ×1 IMPLANT
SHEATH CARTO VIZIGO SM CVD (SHEATH) ×1 IMPLANT
SHEATH PINNACLE 7F 10CM (SHEATH) ×1 IMPLANT
SHEATH PINNACLE 8F 10CM (SHEATH) ×2 IMPLANT
SHEATH PINNACLE 9F 10CM (SHEATH) ×1 IMPLANT
SHEATH PROBE COVER 6X72 (BAG) ×1 IMPLANT
TUBING SMART ABLATE COOLFLOW (TUBING) ×1 IMPLANT

## 2021-06-19 NOTE — Transfer of Care (Signed)
Immediate Anesthesia Transfer of Care Note ? ?Patient: David Swanson ? ?Procedure(s) Performed: ATRIAL FIBRILLATION ABLATION ? ?Patient Location: Cath Lab ? ?Anesthesia Type:General ? ?Level of Consciousness: awake and alert  ? ?Airway & Oxygen Therapy: Patient Spontanous Breathing and Patient connected to nasal cannula oxygen ? ?Post-op Assessment: Report given to RN and Post -op Vital signs reviewed and stable ? ?Post vital signs: Reviewed and stable ? ?Last Vitals:  ?Vitals Value Taken Time  ?BP 142/84 06/19/21 1056  ?Temp    ?Pulse 83 06/19/21 1057  ?Resp 13 06/19/21 1057  ?SpO2 100 % 06/19/21 1057  ?Vitals shown include unvalidated device data. ? ?Last Pain:  ?Vitals:  ? 06/19/21 0714  ?TempSrc:   ?PainSc: 0-No pain  ?   ? ?  ? ?Complications: There were no known notable events for this encounter. ?

## 2021-06-19 NOTE — Progress Notes (Signed)
Pt is very sore at bilateral groin sites, Per Jenita Seashore MD from anesthesia she would like oral pain med given and may leave in 20 minutes. Med given ?

## 2021-06-19 NOTE — Discharge Instructions (Addendum)
Post procedure care instructions ?No driving for 4 days. No lifting over 5 lbs for 1 week. No vigorous or sexual activity for 1 week. You may return to work/your usual activities on 06/27/21. Keep procedure site clean & dry. If you notice increased pain, swelling, bleeding or pus, call/return!  You may shower after 24 hours, but no soaking in baths/hot tubs/pools for 1 week.  ? ? ?You have an appointment set up with the [facility].  Multiple studies have shown that being followed by a dedicated atrial fibrillation clinic in addition to the standard care you receive from your other physicians improves health. We believe that enrollment in the atrial fibrillation clinic will allow [place] to better care for you.  ? ?The phone number to the [facility] is [phone]. The clinic is staffed Monday through Friday from 8:30am to 5pm. ? ?Parking Directions: The clinic is located in the Heart and Vascular Building connected to [facility]. ?1)From [address] turn on to [address] and go to the 3rd entrance  (Heart and Vascular entrance) on the right. ?2)Look to the right for Heart &Vascular Parking Garage. ?3)A code for the entrance is required, for April is 1104.   ?4)Take the elevators to the 1st floor. Registration is in the room with the glass walls at the end of the hallway. ? ?If you have any trouble parking or locating the clinic, please don?t hesitate to call [phone].  ? ?Cardiac Ablation, Care After ? ?This sheet gives you information about how to care for yourself after your procedure. Your health care provider may also give you more specific instructions. If you have problems or questions, contact your health care provider. ?What can I expect after the procedure? ?After the procedure, it is common to have: ?Bruising around your puncture site. ?Tenderness around your puncture site. ?Skipped heartbeats. ?Tiredness (fatigue). ? ?Follow these instructions at  home: ?Puncture site care  ?Follow instructions from your health care provider about how to take care of your puncture site. Make sure you: ?If present, leave stitches (sutures), skin glue, or adhesive strips in place. These skin closures may need to stay in place for up to 2 weeks. If adhesive strip edges start to loosen and curl up, you may trim the loose edges. Do not remove adhesive strips completely unless your health care provider tells you to do that. ?If a large square bandage is present, this may be removed 24 hours after surgery.  ?Check your puncture site every day for signs of infection. Check for: ?Redness, swelling, or pain. ?Fluid or blood. If your puncture site starts to bleed, lie down on your back, apply firm pressure to the area, and contact your health care provider. ?Warmth. ?Pus or a bad smell. ?Driving ?Do not drive for at least 4 days after your procedure or however long your health care provider recommends. (Do not resume driving if you have previously been instructed not to drive for other health reasons.) ?Do not drive or use heavy machinery while taking prescription pain medicine. ?Activity ?Avoid activities that take a lot of effort for at least 7 days after your procedure. ?Do not lift anything that is heavier than 5 lb (4.5 kg) for one week.  ?No sexual activity for 1 week.  ?Return to your normal activities as told by your health care provider. Ask your health care provider what activities are safe for you. ?General instructions ?Take over-the-counter and prescription medicines only as told by your health care  provider. ?Do not use any products that contain nicotine or tobacco, such as cigarettes and e-cigarettes. If you need help quitting, ask your health care provider. ?You may shower after 24 hours, but Do not take baths, swim, or use a hot tub for 1 week.  ?Do not drink alcohol for 24 hours after your procedure. ?Keep all follow-up visits as told by your health care provider. This  is important. ?Contact a health care provider if: ?You have redness, mild swelling, or pain around your puncture site. ?You have fluid or blood coming from your puncture site that stops after applying firm pressure to the area. ?Your puncture site feels warm to the touch. ?You have pus or a bad smell coming from your puncture site. ?You have a fever. ?You have chest pain or discomfort that spreads to your neck, jaw, or arm. ?You are sweating a lot. ?You feel nauseous. ?You have a fast or irregular heartbeat. ?You have shortness of breath. ?You are dizzy or light-headed and feel the need to lie down. ?You have pain or numbness in the arm or leg closest to your puncture site. ?Get help right away if: ?Your puncture site suddenly swells. ?Your puncture site is bleeding and the bleeding does not stop after applying firm pressure to the area. ?These symptoms may represent a serious problem that is an emergency. Do not wait to see if the symptoms will go away. Get medical help right away. Call your local emergency services (911 in the [place] Do not drive yourself to the hospital. ?Summary ?After the procedure, it is normal to have bruising and tenderness at the puncture site in your groin, neck, or forearm. ?Check your puncture site every day for signs of infection. ?Get help right away if your puncture site is bleeding and the bleeding does not stop after applying firm pressure to the area. This is a medical emergency. ?This information is not intended to replace advice given to you by your health care provider. Make sure you discuss any questions you have with your health care provider. ?  ?

## 2021-06-19 NOTE — Anesthesia Procedure Notes (Signed)
Procedure Name: Intubation ?Date/Time: 06/19/2021 8:46 AM ?Performed by: Dorann Lodge, CRNA ?Pre-anesthesia Checklist: Patient identified, Emergency Drugs available, Suction available and Patient being monitored ?Patient Re-evaluated:Patient Re-evaluated prior to induction ?Oxygen Delivery Method: Circle System Utilized ?Preoxygenation: Pre-oxygenation with 100% oxygen ?Induction Type: IV induction ?Ventilation: Mask ventilation without difficulty and Oral airway inserted - appropriate to patient size ?Laryngoscope Size: Mac and 4 ?Grade View: Grade I ?Tube type: Oral ?Tube size: 7.5 mm ?Number of attempts: 1 ?Airway Equipment and Method: Stylet and Oral airway ?Placement Confirmation: ETT inserted through vocal cords under direct vision, positive ETCO2 and breath sounds checked- equal and bilateral ?Secured at: 23 cm ?Tube secured with: Tape ?Dental Injury: Teeth and Oropharynx as per pre-operative assessment  ? ? ? ? ?

## 2021-06-19 NOTE — Interval H&P Note (Signed)
History and Physical Interval Note: ? ?06/19/2021 ?7:00 AM ? ?GEORGIE SHAMBACH  has presented today for surgery, with the diagnosis of afib.  The various methods of treatment have been discussed with the patient and family. After consideration of risks, benefits and other options for treatment, the patient has consented to  Procedure(s): ?[place] (N/A) as a surgical intervention.  The patient's history has been reviewed, patient examined, no change in status, stable for surgery.  I have reviewed the patient's chart and labs.  Questions were answered to the patient's satisfaction.   ? ? ?Shamariah Shewmake Hassell Done Joab Carden ? ? ?

## 2021-06-19 NOTE — Progress Notes (Signed)
Dr Elberta Fortis shown pt post procedure EKG, no new orders given, safety maintained ? ?

## 2021-06-19 NOTE — Anesthesia Postprocedure Evaluation (Signed)
Anesthesia Post Note ? ?Patient: David Swanson ? ?Procedure(s) Performed: ATRIAL FIBRILLATION ABLATION ? ?  ? ?Patient location during evaluation: Phase II ?Anesthesia Type: General ?Level of consciousness: awake and alert, oriented and patient cooperative ?Pain management: pain level controlled ?Vital Signs Assessment: post-procedure vital signs reviewed and stable ?Respiratory status: spontaneous breathing, nonlabored ventilation and respiratory function stable ?Cardiovascular status: blood pressure returned to baseline and stable ?Postop Assessment: no apparent nausea or vomiting, adequate PO intake and able to ambulate ?Anesthetic complications: no ? ? ?There were no known notable events for this encounter. ? ?Last Vitals:  ?Vitals:  ? 06/19/21 1245 06/19/21 1300  ?BP:  133/65  ?Pulse: 75 74  ?Resp: 17 10  ?Temp:    ?SpO2: 100% 99%  ?  ?Last Pain:  ?Vitals:  ? 06/19/21 1436  ?TempSrc:   ?PainSc: 5   ? ? ?  ?  ?  ?  ?  ?  ? ?Caldonia Leap,E. Hosteen Kienast ? ? ? ? ?

## 2021-06-20 ENCOUNTER — Encounter (HOSPITAL_COMMUNITY): Payer: Self-pay | Admitting: Cardiology

## 2021-06-25 ENCOUNTER — Telehealth: Payer: Self-pay

## 2021-06-25 NOTE — Telephone Encounter (Signed)
Error

## 2021-07-17 ENCOUNTER — Encounter (HOSPITAL_COMMUNITY): Payer: Self-pay | Admitting: Physician Assistant

## 2021-07-17 ENCOUNTER — Ambulatory Visit (HOSPITAL_COMMUNITY)
Admission: RE | Admit: 2021-07-17 | Discharge: 2021-07-17 | Disposition: A | Discharge status: Home / Self Care | Payer: BC Managed Care – PPO | Source: Ambulatory Visit | Attending: Physician Assistant | Admitting: Physician Assistant

## 2021-07-17 VITALS — BP 126/80 | HR 70 | Ht 74.0 in | Wt 201.4 lb

## 2021-07-17 DIAGNOSIS — I480 Paroxysmal atrial fibrillation: Secondary | ICD-10-CM | POA: Insufficient documentation

## 2021-07-17 DIAGNOSIS — Z79899 Other long term (current) drug therapy: Secondary | ICD-10-CM | POA: Diagnosis not present

## 2021-07-17 DIAGNOSIS — Z7901 Long term (current) use of anticoagulants: Secondary | ICD-10-CM | POA: Insufficient documentation

## 2021-07-17 DIAGNOSIS — F17210 Nicotine dependence, cigarettes, uncomplicated: Secondary | ICD-10-CM | POA: Diagnosis not present

## 2021-07-17 DIAGNOSIS — Z8249 Family history of ischemic heart disease and other diseases of the circulatory system: Secondary | ICD-10-CM | POA: Insufficient documentation

## 2021-07-17 NOTE — Progress Notes (Signed)
? ?Primary Care Physician: Patient, No Pcp Per (Inactive) ?Referring Physician: [facility] ER visit  ?Primary EP: Dr Curt Bears ? ? ?David Swanson is a 41 y.o. male with a h/o afib dx 6 yrs ago in 2017. He had a successful CV at at that time. He has only noted a couple of short episodes that have occurred since then. He had an echo at the time and was told it was normal. Usually if  he goes back to sleep, for a mild afib episode, he will wake up in [place]. On 7/2, he woke up with heart fluttering around 3 am. When he woke up to go to work, that am,  his heart was still fluttering and he  felt lightheaded so he decided to go to ER. He felt that dehydration from  the day before and also drinking an energy drink and 2 Cokes contributed. He does not believe he drank alcohol that night. He does not usually drink caffeine containing  drinks, other than 2 cups of coffee that am. He smokes 2-3 cigarettes a day and 3-4 times a week, will drink up to 3 alcoholic drinks a day. States he does not snore. He does go to the gym on a regular basis. 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  ? ?He had successful cardioversion in the ER. He is not on daily rate control. He is not anticoagulated with a CHA2DS2VASc score of 0. He remains in SR today.  ? ?Follow up in the AF clinic 05/17/21. Patient woke at 2am with tachypalpitations and "heaviness" in his chest. He does report taking several supplements prior to the onset of his symptoms as he was planning on starting a workout program. He does feel "clammy".  ? ?Follow up in the AF clinic 05/20/21. He presented to the ED on 05/18/21 and underwent DCCV at that time. He states that he felt instantly better after DCCV. He denies any bleeding issues on anticoagulation.  ? ?Follow up in the AF clinic 07/17/21.  Patient is s/p afib ablation with Dr Curt Bears on 06/19/21. He reports he has done well since the procedure. He reports that he occasionally feels his heart "trying to go into afib" but  it resolves quickly. He did have some groin tenderness and CP for several days but these have resolved. No bleeding issues on anticoagulation.  ? ?Today, he denies symptoms of palpitations, shortness of breath, orthopnea, PND, lower extremity edema, dizziness, presyncope, syncope, or neurologic sequela. The patient is tolerating medications without difficulties and is otherwise without complaint today.  ? ?Past Medical History:  ?Diagnosis Date  ? A-fib ([place])   ? 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 LEFT 3 RD INTERSPACE  ? ?Past Surgical History:  ?Procedure Laterality Date  ? ANKLE ARTHROSCOPY Bilateral UNKNOWN  ? TYPE OF SURGERY B/L UNKNOWN  ? ATRIAL FIBRILLATION ABLATION N/A 06/19/2021  ? Procedure: ATRIAL FIBRILLATION ABLATION;  Surgeon: Constance Haw, MD;  Location: [facility];  Service: Cardiovascular;  Laterality: N/A;  ? ? ?Current Outpatient Medications  ?Medication Sig Dispense Refill  ? diltiazem (CARDIZEM CD) 180 MG 24 hr capsule Take 1 capsule (180 mg total) by mouth daily. 30 capsule 3  ? rivaroxaban (XARELTO) 20 MG TABS tablet Take 1 tablet (20 mg total) by mouth daily with supper. 30 tablet 3  ? ?Current Facility-Administered Medications  ?Medication Dose Route Frequency Provider Last Rate Last Admin  ? triamcinolone acetonide (KENALOG) 10 MG/ML injection 10  mg  10 mg Other Once Wallene Huh, DPM      ? ? ?No Known Allergies ? ?Social History  ? ?Socioeconomic History  ? Marital status: Married  ?  Spouse name: Not on file  ? Number of children: Not on file  ? Years of education: Not on file  ? Highest education level: Not on file  ?Occupational History  ? Not on file  ?Tobacco Use  ? Smoking status: Every Day  ?  Packs/day: 0.10  ?  Years: 5.00  ?  Pack years: 0.50  ?  Types: Cigarettes  ?  Start date: 12/23/2007  ?  Passive exposure: Never  ? Smokeless tobacco: Never  ? Tobacco comments:  ?  Half pack daily 05/17/21  ?Vaping Use  ? Vaping Use: Never used  ?Substance and Sexual Activity  ?  Alcohol use: Yes  ?  Alcohol/week: 1.0 - 2.0 standard drink  ?  Types: 1 - 2 Glasses of wine per week  ?  Comment: 05/17/21  ? Drug use: Not Currently  ?  Types: Marijuana  ? Sexual activity: Yes  ?Other Topics Concern  ? Not on file  ?Social History Narrative  ? Not on file  ? ?Social Determinants of Health  ? ?Financial Resource Strain: Not on file  ?Food Insecurity: Not on file  ?Transportation Needs: Not on file  ?Physical Activity: Not on file  ?Stress: Not on file  ?Social Connections: Not on file  ?Intimate Partner Violence: Not on file  ? ? ?Family History  ?Problem Relation Age of Onset  ? Cancer Mother   ? Cancer Father   ?     intestine area  ? Atrial fibrillation Maternal Grandmother   ? ? ?ROS- All systems are reviewed and negative except as per the HPI above ? ?Physical Exam: ?Vitals:  ? 07/17/21 1248  ?BP: 126/80  ?Pulse: 70  ?Weight: 91.4 kg  ?Height: 6\' 2"  (1.88 m)  ? ? ? ? ?Wt Readings from Last 3 Encounters:  ?07/17/21 91.4 kg  ?06/19/21 93 kg  ?06/10/21 92 kg  ? ? ?Labs: ?Lab Results  ?Component Value Date  ? NA 137 06/10/2021  ? K 4.1 06/10/2021  ? CL 98 06/10/2021  ? CO2 24 06/10/2021  ? GLUCOSE 94 06/10/2021  ? BUN 16 06/10/2021  ? CREATININE 1.05 06/10/2021  ? CALCIUM 10.0 06/10/2021  ? MG 2.0 10/06/2020  ? ?No results found for: INR ?No results found for: CHOL, HDL, LDLCALC, TRIG ? ? ?GEN- The patient is a well appearing male, alert and oriented x 3 today.   ?HEENT-head normocephalic, atraumatic, sclera clear, conjunctiva pink, hearing intact, trachea midline. ?Lungs- Clear to ausculation bilaterally, normal work of breathing ?Heart- Regular rate and rhythm, no murmurs, rubs or gallops  ?GI- soft, NT, ND, + BS ?Extremities- no clubbing, cyanosis, or edema ?MS- no significant deformity or atrophy ?Skin- no rash or lesion ?Psych- euthymic mood, full affect ?Neuro- strength and sensation are intact ? ? ?EKG- ?SR ?Vent. rate 70 BPM ?PR interval 142 ms ?QRS duration 98 ms ?QT/QTcB 390/421  ms ? ?Echo- 11/05/20 ? 1. Left ventricular ejection fraction, by estimation, is 60 to 65%. The left ventricle has normal function. The left ventricle has no regional wall motion abnormalities. Left ventricular diastolic parameters were normal.  ? 2. Right ventricular systolic function is normal. The right ventricular size is normal.  ? 3. The mitral valve is normal in structure. Trivial mitral valve  ?regurgitation.  ?  4. The aortic valve is normal in structure. Aortic valve regurgitation is not visualized.  ? 5. The inferior vena cava is normal in size with greater than 50% respiratory variability, suggesting right atrial pressure of 3 mmHg.  ? ?Assessment and Plan:  ?1. Paroxysmal afib   ?S/p afib ablation 06/19/21 ?Patient appears to be maintaining SR.  ?Continue diltiazem 180 mg daily ?Continue Xarelto 20 mg daily with no missed doses for 3 months post ablation.  ?  ?2. CHA2DS2VASc  score of 0  ?Continue Xarelto 20 mg daily for now.  ? ? ?Follow up with Dr Curt Bears as scheduled.  ? ? ?Ricky Tanuj Mullens PA-C ?Afib Clinic ?[facility] ?[address] ?[place],  [address] ?[phone]   ?

## 2021-08-08 ENCOUNTER — Institutional Professional Consult (permissible substitution): Payer: BC Managed Care – PPO | Admitting: Cardiology

## 2021-09-16 ENCOUNTER — Encounter: Payer: Self-pay | Admitting: Cardiology

## 2021-09-16 ENCOUNTER — Ambulatory Visit (INDEPENDENT_AMBULATORY_CARE_PROVIDER_SITE_OTHER): Payer: BC Managed Care – PPO | Admitting: Cardiology

## 2021-09-16 VITALS — BP 110/72 | HR 82 | Ht 74.0 in | Wt 205.2 lb

## 2021-09-16 DIAGNOSIS — I480 Paroxysmal atrial fibrillation: Secondary | ICD-10-CM

## 2021-09-16 NOTE — Progress Notes (Signed)
Electrophysiology Office Note   Date:  09/16/2021   ID:  David Swanson, DOB [birth year], MRN [id number]  PCP:  Patient, No Pcp Per (Inactive)  Cardiologist:   Primary Electrophysiologist:  Charlize Hathaway Jorja Loa, MD    Chief Complaint: AF   History of Present Illness: David Swanson is a 41 y.o. male who is being seen today for the evaluation of AF at the request of No ref. provider found. Presenting today for electrophysiology evaluation.  He has a history significant for atrial fibrillation.  He was diagnosed in 2017.  He had a cardioversion at the time.  He then had a few short episodes of atrial fibrillation.  On 10/06/2020 he woke up with fluttering and was noted to be in atrial fibrillation.  He had follow-up in the A-fib clinic 05/17/2021.  He has subsequently woke up with palpitations.  He is now status post atrial fibrillation ablation 06/19/2021  Today, denies symptoms of palpitations, chest pain, shortness of breath, orthopnea, PND, lower extremity edema, claudication, dizziness, presyncope, syncope, bleeding, or neurologic sequela. The patient is tolerating medications without difficulties.  Since his ablation he has done well.  He had a few episodes of palpitations in the months after his ablation, but he has not had any since that time.  He is overall quite happy with his control.  He is feeling much better with less fatigue and shortness of breath.   Past Medical History:  Diagnosis Date   A-fib [facility])    [place] neuroma [id number]   LEFT 3 RD INTERSPACE   Past Surgical History:  Procedure Laterality Date   ANKLE ARTHROSCOPY Bilateral UNKNOWN   TYPE OF SURGERY B/L UNKNOWN   ATRIAL FIBRILLATION ABLATION N/A 06/19/2021   Procedure: ATRIAL FIBRILLATION ABLATION;  Surgeon: Regan Lemming, MD;  Location: [facility];  Service: Cardiovascular;  Laterality: N/A;     No current outpatient medications on file.   Current Facility-Administered Medications  Medication  Dose Route Frequency Provider Last Rate Last Admin   triamcinolone acetonide (KENALOG) 10 MG/ML injection 10 mg  10 mg Other Once Lenn Sink, DPM        Allergies:   Patient has no known allergies.   Social History:  The patient  reports that he has been smoking cigarettes. He started smoking about 13 years ago. He has a 0.50 pack-year smoking history. He has never been exposed to tobacco smoke. He has never used smokeless tobacco. He reports current alcohol use of about 1.0 - 2.0 standard drink of alcohol per week. He reports that he does not currently use drugs after having used the following drugs: Marijuana.   Family History:  The patient's family history includes Atrial fibrillation in his maternal grandmother; Cancer in his father and mother.   ROS:  Please see the history of present illness.   Otherwise, review of systems is positive for none.   All other systems are reviewed and negative.   PHYSICAL EXAM: VS:  BP 110/72 (BP Location: Left Arm, Patient Position: Sitting, Cuff Size: Normal)   Pulse 82   Ht 6\' 2"  (1.88 m)   Wt 205 lb 3.2 oz (93.1 kg)   BMI 26.35 kg/m  , BMI Body mass index is 26.35 kg/m. GEN: Well nourished, well developed, in no acute distress  HEENT: normal  Neck: no JVD, carotid bruits, or masses Cardiac: RRR; no murmurs, rubs, or gallops,no edema  Respiratory:  clear to auscultation bilaterally, normal work of breathing GI: soft,  nontender, nondistended, + BS MS: no deformity or atrophy  Skin: warm and dry Neuro:  Strength and sensation are intact Psych: euthymic mood, full affect  EKG:  EKG is ordered today. Personal review of the ekg ordered shows sinus rhythm, rate 82  Recent Labs: 10/06/2020: Magnesium 2.0; TSH 1.248 06/10/2021: BUN 16; Creatinine, Ser 1.05; Hemoglobin 15.4; Platelets 224; Potassium 4.1; Sodium 137    Lipid Panel  No results found for: "CHOL", "TRIG", "HDL", "CHOLHDL", "VLDL", "LDLCALC", "LDLDIRECT"   Wt Readings from Last 3  Encounters:  09/16/21 205 lb 3.2 oz (93.1 kg)  07/17/21 201 lb 6.4 oz (91.4 kg)  06/19/21 205 lb (93 kg)      Other studies Reviewed: Additional studies/ records that were reviewed today include: TTE 11/05/20  Review of the above records today demonstrates:   1. Left ventricular ejection fraction, by estimation, is 60 to 65%. The  left ventricle has normal function. The left ventricle has no regional  wall motion abnormalities. Left ventricular diastolic parameters were  normal.   2. Right ventricular systolic function is normal. The right ventricular  size is normal.   3. The mitral valve is normal in structure. Trivial mitral valve  regurgitation.   4. The aortic valve is normal in structure. Aortic valve regurgitation is  not visualized.   5. The inferior vena cava is normal in size with greater than 50%  respiratory variability, suggesting right atrial pressure of 3 mmHg.   ASSESSMENT AND PLAN:  1.  Paroxysmal atrial fibrillation: Currently on diltiazem 180 mg, Xarelto 20 mg daily.  Is status post ablation 06/19/2021.  He is remained in sinus rhythm.  He has had no further episodes of atrial fibrillation over the last month and a half.  He did have a few episodes immediately after ablation.  He would like to get off his medications.  We David Swanson stop diltiazem and Xarelto.  Current medicines are reviewed at length with the patient today.   The patient does not have concerns regarding his medicines.  The following changes were made today: Stop diltiazem, Xarelto  Labs/ tests ordered today include:  Orders Placed This Encounter  Procedures   EKG 12-Lead     Disposition:   FU 6 months  Signed, David Mcmenamin Jorja Loa, MD  09/16/2021 2:36 PM     [facility] [address] [place] [address] [phone] (office) [phone] (fax)

## 2022-05-15 ENCOUNTER — Encounter (HOSPITAL_COMMUNITY): Payer: Self-pay | Admitting: *Deleted

## 2022-05-29 ENCOUNTER — Encounter: Payer: Self-pay | Admitting: Cardiology

## 2022-05-29 ENCOUNTER — Ambulatory Visit: Payer: BC Managed Care – PPO | Attending: Cardiology | Admitting: Cardiology

## 2022-05-29 VITALS — BP 120/76 | HR 85 | Ht 74.0 in | Wt 203.0 lb

## 2022-05-29 DIAGNOSIS — R0681 Apnea, not elsewhere classified: Secondary | ICD-10-CM | POA: Diagnosis not present

## 2022-05-29 DIAGNOSIS — R400 Somnolence: Secondary | ICD-10-CM

## 2022-05-29 DIAGNOSIS — I480 Paroxysmal atrial fibrillation: Secondary | ICD-10-CM

## 2022-05-29 NOTE — Progress Notes (Signed)
Electrophysiology Office Note   Date:  05/29/2022   ID:  David Swanson, DOB Jul 19, 1980, MRN [id number]  PCP:  Patient, No Pcp Per  Cardiologist:   Primary Electrophysiologist:  Emily Filbert, PA-C    Chief Complaint: AF   History of Present Illness: David Swanson is a 42 y.o. male who is being seen today for the evaluation of AF at the request of No ref. provider found. Presenting today for electrophysiology evaluation.  He has a history significant for atrial fibrillation.  He was diagnosed in 2017.  He had a cardioversion at the time.  He then had a few short episodes of atrial fibrillation.  On 10/06/2020 he woke up with fluttering and was noted to be in atrial fibrillation.  He had follow-up in the A-fib clinic 05/17/2021.  He has subsequently woke up with palpitations.  He is now status post atrial fibrillation ablation 06/19/2021. He stopped Xarelto and diltiazem after last visit on 09/16/2021.  Since last office visit, he has noted intermittent extra beats especially while he is stressed or with lack of sleep. He says it can last from several seconds to up to half an hour. He denies these are AF episodes. He states in the past he knew when he was in AF because episodes consisted of rapid heart rate and shortness of breath.   He also notes the sensation of stopping breathing while he is falling asleep at night. His wife has not specifically noted him stopping breathing or snoring loudly at night. 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   Today, denies symptoms of chest pain, shortness of breath, orthopnea, PND, lower extremity edema, claudication, dizziness, presyncope, syncope, bleeding, or neurologic sequela. The patient is tolerating medications without difficulties.  Since his ablation he has done well. He has reduced his smoking to 1-2 cigarettes daily.    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   Current Facility-Administered Medications  Medication Dose Route Frequency Provider Last Rate Last Admin   triamcinolone acetonide (KENALOG) 10 MG/ML injection 10 mg  10 mg Other Once Wallene Huh, DPM        Allergies:   Patient has no known allergies.   Social History:  The patient  reports that he has been smoking cigarettes. He started smoking about 14 years ago. He has a 0.50 pack-year smoking history. He has never been exposed to tobacco smoke. He has never used smokeless tobacco. He reports current alcohol use of about 1.0 - 2.0 standard drink of alcohol per week. He reports that he does not currently use drugs after having used the following drugs: Marijuana.   Family History:  The patient's family history includes Atrial fibrillation in his maternal grandmother; Cancer in his father and mother.   ROS:  Please see the history of present illness.   Otherwise, review of systems is positive for none.   All other systems are reviewed and negative.   PHYSICAL EXAM: VS:  BP 120/76   Pulse 85  Ht 6' 2"$  (1.88 m)   Wt 203 lb (92.1 kg)   SpO2 98%   BMI 26.06 kg/m  , BMI Body mass index is 26.06 kg/m. GEN: Well nourished, well developed, in no acute distress  HEENT: normal  Neck: no JVD, carotid bruits, or masses Cardiac: RRR; no murmurs, rubs, or gallops,no edema  Respiratory:  clear to auscultation bilaterally, normal work of breathing GI: soft, nontender, nondistended, + BS MS: no deformity or atrophy  Skin: warm and dry Neuro:  Strength and sensation are intact Psych: euthymic mood, full affect  EKG:  EKG  is ordered today. Personal review of the ekg ordered shows sinus rhythm with nonspecific ST abnormality, rate 85  Recent Labs: 06/10/2021: BUN 16; Creatinine, Ser 1.05; Hemoglobin 15.4; Platelets 224; Potassium 4.1; Sodium 137    Lipid Panel  No results found for: "CHOL", "TRIG", "HDL", "CHOLHDL", "VLDL", "LDLCALC", "LDLDIRECT"   Wt Readings from Last 3 Encounters:  05/29/22 203 lb (92.1 kg)  09/16/21 205 lb 3.2 oz (93.1 kg)  07/17/21 201 lb 6.4 oz (91.4 kg)      Other studies Reviewed: Additional studies/ records that were reviewed today include: TTE 11/05/20  Review of the above records today demonstrates:   1. Left ventricular ejection fraction, by estimation, is 60 to 65%. The  left ventricle has normal function. The left ventricle has no regional  wall motion abnormalities. Left ventricular diastolic parameters were  normal.   2. Right ventricular systolic function is normal. The right ventricular  size is normal.   3. The mitral valve is normal in structure. Trivial mitral valve  regurgitation.   4. The aortic valve is normal in structure. Aortic valve regurgitation is  not visualized.   5. The inferior vena cava is normal in size with greater than 50%  respiratory variability, suggesting right atrial pressure of 3 mmHg.   ASSESSMENT AND PLAN:  1.  Paroxysmal atrial fibrillation: S/p ablation 06/19/2021. He stopped diltiazem and xarelto at last office visit. Given intermittent episodes of palpitations, he Diane Mochizuki buy a Kardia mobile device and send me recordings in the near future of his episodes. I Roseann Kees contact him with interpretation via mychart.   2. Stop breathing  - After discussion with patient, he would like to proceed with Itamar sleep study for further investigation. I am happy to order this and we Jenita Rayfield let him know the results.  Labs/ tests ordered today include:  Orders Placed This Encounter  Procedures   EKG 12-Lead   Itamar Sleep Study     Disposition:    FU 1 year  Signed, Clabe Seal  05/29/2022 4:24 PM     [facility] [address] [place] [address] [phone] (office) [phone] (fax)  I have seen and examined this patient with Emily Filbert.  Agree with above, note added to reflect my findings.  He is currently feeling well.  He does have intermittent palpitations that last up to 30 minutes at a time.  These palpitations are different than his atrial fibrillation.  They happen every few weeks.  He also notes that when he is in the process of falling asleep, he feels that he has apneic episodes.  His wife does not feel that he has had any apnea while asleep, though he does potentially snore.  GEN: Well nourished, well developed, in no acute distress  HEENT: normal  Neck: no JVD, carotid bruits, or masses Cardiac: RRR; no murmurs, rubs, or gallops,no edema  Respiratory:  clear to auscultation bilaterally, normal work of breathing GI: soft, nontender, nondistended, + BS MS: no deformity or atrophy  Skin: warm and dry Neuro:  Strength and sensation are intact Psych: euthymic mood, full affect   Paroxysmal atrial fibrillation: Status post ablation.  He remains in sinus rhythm.  Currently doing well.  Vaden Becherer continue with current management. Possible sleep apnea: Sleep study planned.  Jemiah Ellenburg M. Doug Bucklin MD 05/29/2022 4:43 PM

## 2022-05-29 NOTE — Patient Instructions (Addendum)
Medication Instructions:  Your physician recommends that you continue on your current medications as directed. Please refer to the Current Medication list given to you today.  *If you need a refill on your cardiac medications before your next appointment, please call your pharmacy*   Lab Work: None ordered   Testing/Procedures: Your physician has recommended that you have a sleep study. This test records several body functions during sleep, including: brain activity, eye movement, oxygen and carbon dioxide blood levels, heart rate and rhythm, breathing rate and rhythm, the flow of air through your mouth and nose, snoring, body muscle movements, and chest and belly movement.    Follow-Up: At [facility], you and your health needs are our priority.  As part of our continuing mission to provide you with exceptional heart care, we have created designated Provider Care Teams.  These Care Teams include your primary Cardiologist (physician) and Advanced Practice Providers (APPs -  Physician Assistants and Nurse Practitioners) who all work together to provide you with the care you need, when you need it.  We recommend signing up for the patient portal called "MyChart".  Sign up information is provided on this After Visit Summary.  MyChart is used to connect with patients for Virtual Visits (Telemedicine).  Patients are able to view lab/test results, encounter notes, upcoming appointments, etc.  Non-urgent messages can be sent to your provider as well.   To learn more about what you can do with MyChart, go to [email or web address].    Your next appointment:   1 year(s)  The format for your next appointment:   In Person  Provider:   Allegra Lai, MD    Thank you for choosing [facility]!!   Trinidad Curet, RN [phone]

## 2022-05-30 ENCOUNTER — Telehealth: Payer: Self-pay | Admitting: *Deleted

## 2022-05-30 NOTE — Telephone Encounter (Signed)
Dr. Curt Bears ordered an Itamar sleep study 05/29/22. Pt agreeable to signed waiver. I will register the pt today as I was out of the office yesterday. Pt aware to not open the box until he has been called with the PIN#.

## 2022-06-03 NOTE — Telephone Encounter (Signed)
Prior Authorization for [facility] sent to [facility] [place] via Phone. Reference # . READY- NO PA REQ-CALL REF [id number]

## 2022-06-03 NOTE — Telephone Encounter (Signed)
Tried to call the pt to give him PIN# [id number] for sleep study, though his vm is full and could not leave a message for the pt.

## 2022-06-05 NOTE — Telephone Encounter (Signed)
Called and made the patient aware that he may proceed with the [facility] Sleep Study. PIN # provided to the patient. Patient made aware that he will be contacted after the test has been read with the results and any recommendations. Patient verbalized understanding and thanked me for the call.   Pt has been given PIN# [id number]. Pt will be out of town for a funeral but will do the study sometime by the weekend of March 9th, 2024. Pt aware will call with results once the study has been completed.

## 2022-07-18 ENCOUNTER — Telehealth: Payer: Self-pay | Admitting: Cardiology

## 2022-07-18 NOTE — Telephone Encounter (Signed)
Paper Work Dropped Off: From to get [employer] Card  Date: 46.56.81  Location of paper: Put in Dr [employer]

## 2022-07-22 ENCOUNTER — Encounter: Payer: Self-pay | Admitting: *Deleted

## 2022-07-22 NOTE — Telephone Encounter (Signed)
Patient is requesting updates on DOT paperwork.

## 2022-07-22 NOTE — Telephone Encounter (Signed)
Pt aware that Dr. Elberta Fortis only returned to the office today and signed today. Aware I will leave that paper and clearance letter at front desk for him to pick up. Pt thankful for the help with this.

## 2022-11-30 IMAGING — CT CT HEART MORPH/PULM VEIN W/ CM & W/O CA SCORE
2 of 5 series · 9 of 20 positions shown, 11 images · IV contrast (APPLIED)
Comparison: None.
COMPARISON: None.

Addendum:
EXAM:
OVER-READ INTERPRETATION  CT CHEST

The following report is an over-read performed by radiologist Dr.
Ariqa Van Wyk [REDACTED] on 06/13/2021. This
over-read does not include interpretation of cardiac or coronary
anatomy or pathology. The coronary calcium score/coronary CTA
interpretation by the cardiologist is attached.
CLINICAL DATA: Atrial fibrillation scheduled for ablation.
Cardiac CTA
TECHNIQUE: A non-contrast, gated CT scan was obtained with axial slices of 3 mm
through the heart for calcium scoring. Calcium scoring was performed
using the Agatston method. A 120 kV retrospective, gated, contrast
cardiac scan was obtained. Gantry rotation speed was 250 msecs and
collimation was 0.6 mm. Nitroglycerin was not given. A delayed scan
was obtained to exclude left atrial appendage thrombus. The 3D
dataset was reconstructed in 5% intervals of the 0-95% of the R-R
cycle. Late systolic phases were analyzed on a dedicated workstation
using MPR, MIP, and VRT modes. The patient received 80 cc of
contrast.

[Series 6: best syst · axial · 0.36mm/px · z∈[+1122,+1209]mm · 6 of 306 slices shown, 8 images]
[im 44/306  vessel]
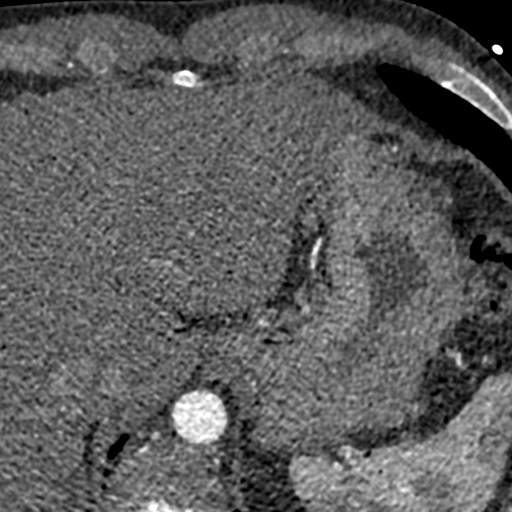
[im 44/306  lung]
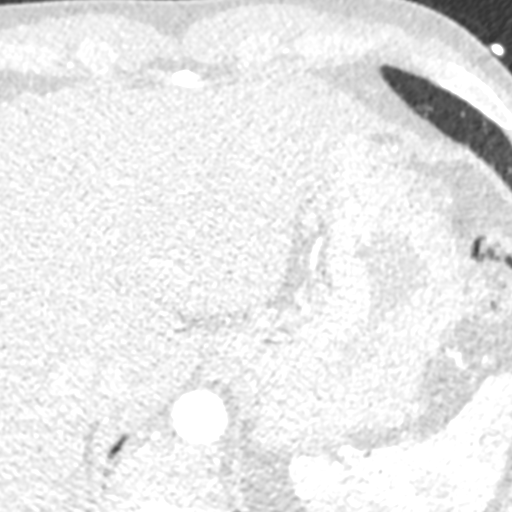
[im 88/306  vessel]
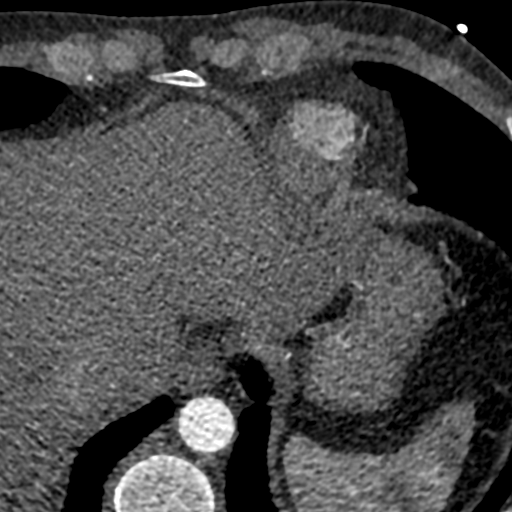
[im 131/306  vessel]
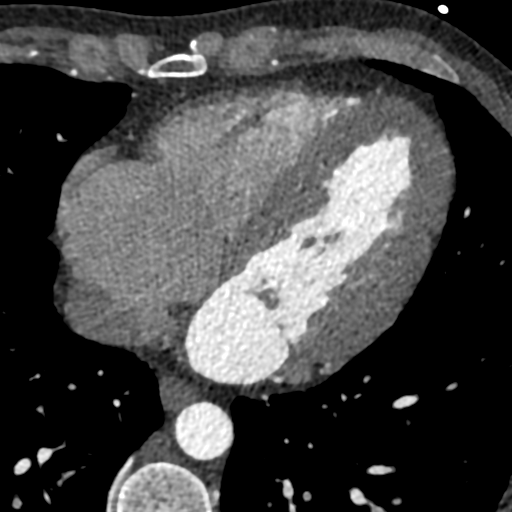
[im 175/306  vessel]
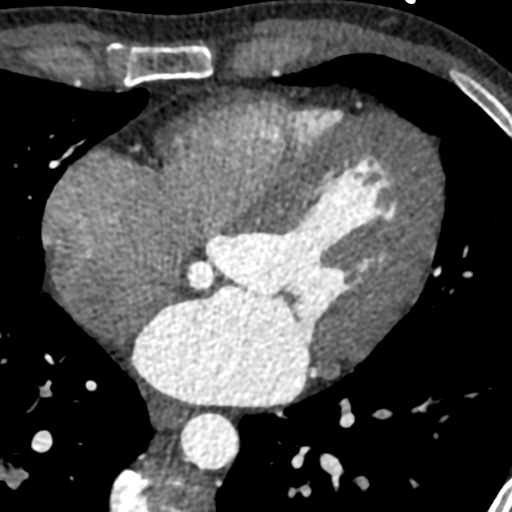
[im 218/306  vessel]
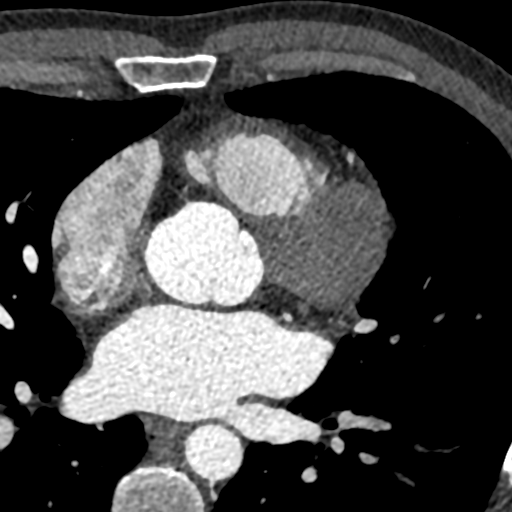
[im 218/306  lung]
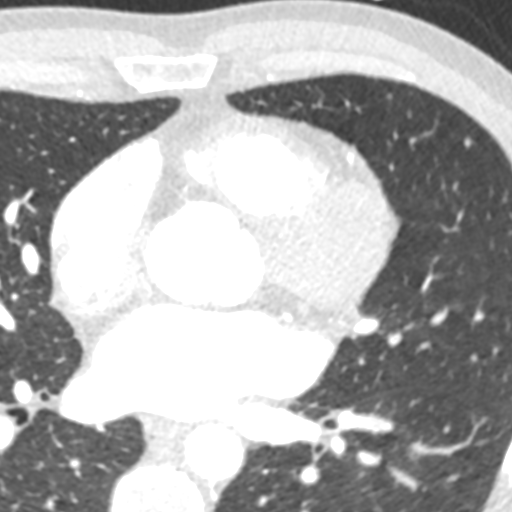
[im 262/306  vessel]
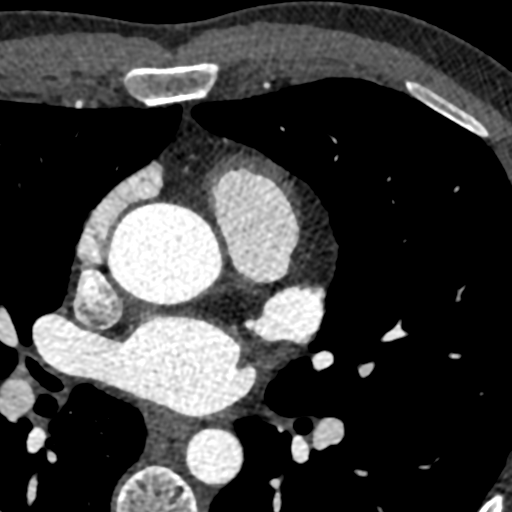

[Series 10: pv delay · axial · portal-venous · 0.36mm/px · z∈[+1152,+1202]mm · 3 of 201 slices shown]
[im 51/201  vessel]
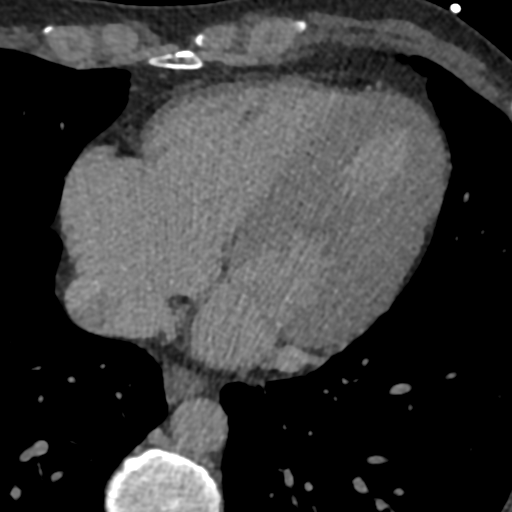
[im 101/201  vessel]
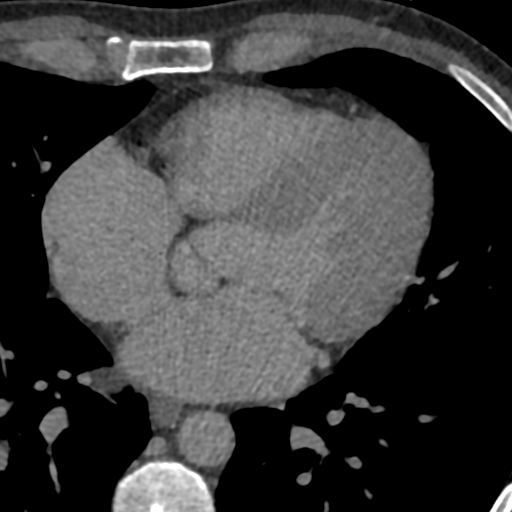
[im 151/201  vessel]
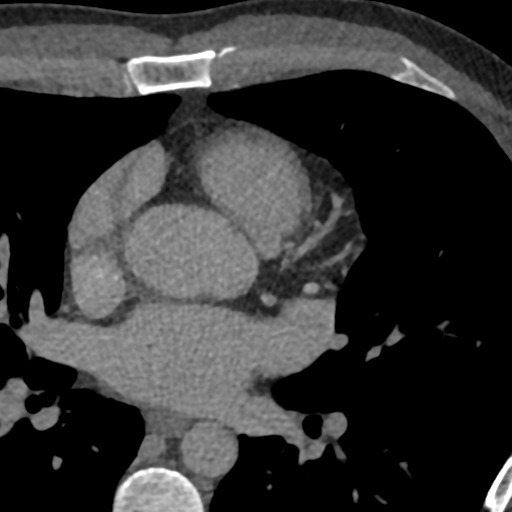

[9 of 20 positions shown; findings below may reference images not displayed]

FINDINGS: Within the visualized portions of the thorax there are no suspicious
appearing pulmonary nodules or masses, there is no acute
consolidative airspace disease, no pleural effusions, no
pneumothorax and no lymphadenopathy. Visualized portions of the
upper abdomen are unremarkable. There are no aggressive appearing
lytic or blastic lesions noted in the visualized portions of the
skeleton.
IMPRESSION: 1. No significant incidental noncardiac findings are noted.
FINDINGS: Image quality: Excellent

Noise artifact is: Limited

Pulmonary Veins: There is normal pulmonary vein drainage into the
left atrium (2 on the right and 2 on the left) with ostial
measurements as follows:

RUPV: Ostium 20.2 x 17.6 mm, area 2.71 cm2

RLPV: Ostium 22.2 x 15.7 mm, area 2.72 cm2

LUPV:  Ostium 23.2 x 14.7 mm, area 2.52 cm2

LLPV:  Ostium 19.3 x 10.9  mm, area 1.61 cm2

Left Atrium: The left atrial size is normal. There is no PFO/ASD.
The left atrial appendage is chicken wing type and ostial size
x 18.4 mm. There is no thrombus in the left atrial appendage on
contrast or delayed imaging. The esophagus runs in the left atrial
midline and is not in proximity to any of the pulmonary vein ostia.

Coronary Arteries: CAC score of 0, which is 0 percentile for age-,
race-, and sex-matched controls. Normal coronary origin. Left
dominance. The study was performed without use of NTG and is
insufficient for plaque evaluation.

Pericardium: Normal thickness with no significant effusion or
calcium present.

Pulmonary Artery: Normal caliber without proximal filling defect.

Cardiac valves: The aortic valve is trileaflet without significant
calcification. The mitral valve is normal structure without
significant calcification.

Aorta: Borderline dilated caliber (38 mm) with no significant
disease.

Extra-cardiac findings: See attached radiology report for
non-cardiac structures.
IMPRESSION: 1. There is normal pulmonary vein drainage into the left atrium with
ostial measurements above.

2. There is no thrombus in the left atrial appendage.

3. The esophagus runs in the left atrial midline and is not in
proximity to any of the pulmonary vein ostia.

4. No PFO/ASD.

5. Normal coronary origin. Right dominance.

6. CAC score of 0 which is 0 percentile for age-, race-, and
sex-matched controls.

*** End of Addendum ***
EXAM:
OVER-READ INTERPRETATION  CT CHEST

The following report is an over-read performed by radiologist Dr.
Ariqa Van Wyk [REDACTED] on 06/13/2021. This
over-read does not include interpretation of cardiac or coronary
anatomy or pathology. The coronary calcium score/coronary CTA
interpretation by the cardiologist is attached.
FINDINGS: Within the visualized portions of the thorax there are no suspicious
appearing pulmonary nodules or masses, there is no acute
consolidative airspace disease, no pleural effusions, no
pneumothorax and no lymphadenopathy. Visualized portions of the
upper abdomen are unremarkable. There are no aggressive appearing
lytic or blastic lesions noted in the visualized portions of the
skeleton.
IMPRESSION: 1. No significant incidental noncardiac findings are noted.

## 2023-05-18 ENCOUNTER — Emergency Department (HOSPITAL_BASED_OUTPATIENT_CLINIC_OR_DEPARTMENT_OTHER): Payer: BC Managed Care – PPO | Admitting: Radiology

## 2023-05-18 ENCOUNTER — Other Ambulatory Visit (HOSPITAL_BASED_OUTPATIENT_CLINIC_OR_DEPARTMENT_OTHER): Payer: Self-pay

## 2023-05-18 ENCOUNTER — Emergency Department (HOSPITAL_BASED_OUTPATIENT_CLINIC_OR_DEPARTMENT_OTHER)
Admission: EM | Admit: 2023-05-18 | Discharge: 2023-05-18 | Disposition: A | Discharge status: Home / Self Care | Payer: BC Managed Care – PPO | Attending: Emergency Medicine | Admitting: Emergency Medicine

## 2023-05-18 ENCOUNTER — Other Ambulatory Visit: Payer: Self-pay

## 2023-05-18 ENCOUNTER — Encounter (HOSPITAL_BASED_OUTPATIENT_CLINIC_OR_DEPARTMENT_OTHER): Payer: Self-pay

## 2023-05-18 DIAGNOSIS — S61411A Laceration without foreign body of right hand, initial encounter: Secondary | ICD-10-CM | POA: Diagnosis present

## 2023-05-18 DIAGNOSIS — Z23 Encounter for immunization: Secondary | ICD-10-CM | POA: Insufficient documentation

## 2023-05-18 DIAGNOSIS — W25XXXA Contact with sharp glass, initial encounter: Secondary | ICD-10-CM | POA: Insufficient documentation

## 2023-05-18 DIAGNOSIS — I10 Essential (primary) hypertension: Secondary | ICD-10-CM | POA: Diagnosis not present

## 2023-05-18 MED ORDER — TETANUS-DIPHTH-ACELL PERTUSSIS 5-2.5-18.5 LF-MCG/0.5 IM SUSY
0.5000 mL | PREFILLED_SYRINGE | Freq: Once | INTRAMUSCULAR | Status: AC
  Administered 2023-05-18: 0.5 mL via INTRAMUSCULAR
  Filled 2023-05-18: qty 0.5

## 2023-05-18 MED ORDER — IBUPROFEN 600 MG PO TABS: 600.0000 mg | ORAL_TABLET | Freq: Four times a day (QID) | ORAL | 0 refills | Status: DC | PRN

## 2023-05-18 MED ORDER — CEFADROXIL 500 MG PO CAPS
500.0000 mg | ORAL_CAPSULE | Freq: Two times a day (BID) | ORAL | 0 refills | Status: AC
  Filled 2023-05-18: qty 10, 5d supply, fill #0

## 2023-05-18 MED ORDER — LIDOCAINE-EPINEPHRINE (PF) 2 %-1:200000 IJ SOLN
20.0000 mL | Freq: Once | INTRAMUSCULAR | Status: DC
  Filled 2023-05-18: qty 20

## 2023-05-18 MED ORDER — CEFADROXIL 500 MG PO CAPS: 500.0000 mg | ORAL_CAPSULE | Freq: Two times a day (BID) | ORAL | 0 refills | Status: DC

## 2023-05-18 MED ORDER — IBUPROFEN 600 MG PO TABS
600.0000 mg | ORAL_TABLET | Freq: Four times a day (QID) | ORAL | 0 refills | Status: AC | PRN
  Filled 2023-05-18 (×2): qty 30, 8d supply, fill #0

## 2023-05-18 NOTE — ED Triage Notes (Addendum)
 In for eval of u-shaped lacerationavulsion to right thumb after sliding on mat into his garage and hitting a window pane last night at approx 2000. Dressing in place. Bleeding controlled.

## 2023-05-18 NOTE — Discharge Instructions (Addendum)
 As discussed, your lacerations were repaired using 12 nonabsorbable sutures.  These should be removed in 10 days or so.  Recommend washing area gently with warm soapy water, patting dry and placing sterile dressing over area.  Recommend changing her bandages at least once daily.  Will send home with antibiotics given amount of time since wound was initially present.  You may take Tylenol /Motrin  for pain.  Please not hesitate to return to emergency department if the worrisome signs and symptoms we discussed become apparent.  He may return to the ER, urgent care, primary care for suture removal.

## 2023-05-18 NOTE — ED Provider Notes (Signed)
 [facility] AT [facility] Provider Note   CSN: [id number] Arrival date & time: 05/18/23  4098     History  Chief Complaint  Patient presents with   Laceration    David Swanson is a 43 y.o. male.   Laceration   43 year old male presents emergency department with complaints of laceration.  Patient states that he was walking outside in his garage on his socks last night when he slipped and fell on a piece of broken glass beside his fridge.  Reports cutting his right thumb as well as the palm of his right hand.  States that he ran the wounds under water and wrapped up area last night.  States that he wanted to finish the game so he was not seen yesterday.  His areas been painful since then.  Reports some decrease sensation on the lateral aspect of his right thumb otherwise, intact sensation.  Denies trauma elsewhere.  Past medical history significant for Morton neuroma, A-fib  Home Medications Prior to Admission medications   Medication Sig Start Date End Date Taking? Authorizing Provider  cefadroxil (DURICEF) 500 MG capsule Take 1 capsule (500 mg total) by mouth 2 (two) times daily. 05/18/23   Peter Garter, PA  ibuprofen (ADVIL) 600 MG tablet Take 1 tablet (600 mg total) by mouth every 6 (six) hours as needed. 05/18/23   Peter Garter, PA      Allergies    Patient has no known allergies.    Review of Systems   Review of Systems  All other systems reviewed and are negative.   Physical Exam Updated Vital Signs BP (!) 160/106   Pulse 68   Temp 98.9 F (37.2 C)   Resp 17   Ht 6\' 2"  (1.88 m)   Wt 95.3 kg   SpO2 97%   BMI 26.96 kg/m  Physical Exam Vitals and nursing note reviewed.  Constitutional:      General: He is not in acute distress.    Appearance: He is well-developed.  HENT:     Head: Normocephalic and atraumatic.  Eyes:     Conjunctiva/sclera: Conjunctivae normal.  Cardiovascular:     Rate and Rhythm: Normal rate and  regular rhythm.     Heart sounds: No murmur heard. Pulmonary:     Effort: Pulmonary effort is normal. No respiratory distress.     Breath sounds: Normal breath sounds.  Abdominal:     Palpations: Abdomen is soft.     Tenderness: There is no abdominal tenderness.  Musculoskeletal:        General: No swelling.     Cervical back: Neck supple.     Comments: U-shaped laceration anterior lateral aspect of right thumb.  Measuring 6.2 cm in length.  Appears to be skin avulsion attached at the distal aspect.  Additional laceration appreciated medial aspect of pad of thumb measuring 1.5 cm in length.  Additional laceration appreciated mid palm of right hand measuring 1 cm in length.  Patient able to flex and extend, AB/adductor right thumb.  No obvious tendinous involvement upon initial exam.  Full range of motion of digits otherwise.  Slightly decreased sensation on the anterior lateral aspect of right thumb.  Skin:    General: Skin is warm and dry.     Capillary Refill: Capillary refill takes less than 2 seconds.  Neurological:     Mental Status: He is alert.  Psychiatric:        Mood and Affect: Mood normal.  ED Results / Procedures / Treatments   Labs (all labs ordered are listed, but only abnormal results are displayed) Labs Reviewed - No data to display  EKG None  Radiology DG Hand Complete Right Result Date: 05/18/2023 CLINICAL DATA:  U shaped laceration/avulsion to right thumb after sliding on mat in garage and hitting a window pain last night at approximately a PM. EXAM: RIGHT HAND - COMPLETE 3+ VIEW COMPARISON:  None available FINDINGS: Normal bone mineralization. No definite avulsion fracture fragment is seen around the thumb metacarpophalangeal joint. There is bandage material overlying lucency from the reported laceration within the dorsal lateral aspect of the thumb at the level of the proximal phalanx. There is a 1 mm mineralized density just lateral/radial to the proximal  metaphysis of the distal phalanx of the thumb on frontal view of the thumb. A similar density overlies the dorsal soft tissues at the same distance of the thumb on lateral view of the thumb, and on the oblique view a similar density overlies the skin surface. No definite donor site is seen to suggest an avulsion injury. Joint spaces are preserved.  Normal alignment. IMPRESSION: 1. No definite avulsion fracture fragment is seen around the thumb metacarpophalangeal joint. 2. There is a 1 mm density just lateral/radial to the proximal metaphysis of the distal phalanx of the thumb on frontal view of the thumb. A similar density overlies the dorsal soft tissues at the same distance of the thumb on lateral view of the thumb, and on the oblique view a similar density overlies the skin surface. Recommend clinical correlation for debris on the skin surface versus a mineralized density that could represent an avulsion bone injury near the lateral base of the distal phalanx of the thumb, however that is felt less likely given this is distal to the expected region of the thumb interphalangeal radial collateral ligament and no donor site is seen. Electronically Signed   By: Neita Garnet M.D.   On: 05/18/2023 12:17    Procedures .Laceration Repair  Date/Time: 05/18/2023 3:24 PM  Performed by: Peter Garter, PA Authorized by: Peter Garter, PA   Consent:    Consent obtained:  Verbal   Consent given by:  Patient   Risks, benefits, and alternatives were discussed: yes     Risks discussed:  Infection, need for additional repair and nerve damage   Alternatives discussed:  No treatment and delayed treatment Universal protocol:    Procedure explained and questions answered to patient or proxy's satisfaction: yes     Patient identity confirmed:  Verbally with patient Anesthesia:    Anesthesia method:  Local infiltration   Local anesthetic:  Lidocaine 2% WITH epi Laceration details:    Location:  Finger    Finger location:  R thumb   Length (cm):  6.2 Pre-procedure details:    Preparation:  Patient was prepped and draped in usual sterile fashion and imaging obtained to evaluate for foreign bodies Exploration:    Limited defect created (wound extended): no     Hemostasis achieved with:  Direct pressure   Imaging obtained: x-ray     Imaging outcome: foreign body noted     Wound exploration: wound explored through full range of motion and entire depth of wound visualized   Treatment:    Area cleansed with:  Chlorhexidine and saline   Amount of cleaning:  Standard   Irrigation solution:  Sterile water   Irrigation volume:  500cc   Irrigation method:  Syringe  Visualized foreign bodies/material removed: no     Debridement:  None   Undermining:  None   Scar revision: no   Skin repair:    Repair method:  Sutures   Suture size:  4-0   Suture material:  Nylon and Prolene   Suture technique:  Simple interrupted   Number of sutures:  10 Approximation:    Approximation:  Close Repair type:    Repair type:  Simple Post-procedure details:    Dressing:  Non-adherent dressing   Procedure completion:  Tolerated well, no immediate complications .Laceration Repair  Date/Time: 05/18/2023 3:25 PM  Performed by: Peter Garter, PA Authorized by: Peter Garter, PA   Consent:    Consent obtained:  Verbal   Consent given by:  Patient   Risks, benefits, and alternatives were discussed: yes     Risks discussed:  Infection, need for additional repair and nerve damage   Alternatives discussed:  No treatment and delayed treatment Universal protocol:    Procedure explained and questions answered to patient or proxy's satisfaction: yes     Patient identity confirmed:  Verbally with patient Anesthesia:    Anesthesia method:  Local infiltration   Local anesthetic:  Lidocaine 2% WITH epi Laceration details:    Location:  Hand   Hand location:  R palm   Length (cm):  1.5 Pre-procedure  details:    Preparation:  Imaging obtained to evaluate for foreign bodies Exploration:    Limited defect created (wound extended): no     Hemostasis achieved with:  Direct pressure   Imaging obtained: x-ray     Imaging outcome: foreign body not noted     Wound exploration: wound explored through full range of motion and entire depth of wound visualized     Contaminated: no   Treatment:    Area cleansed with:  Povidone-iodine and chlorhexidine   Amount of cleaning:  Standard   Irrigation solution:  Sterile water   Irrigation volume:  150 cc   Irrigation method:  Syringe   Visualized foreign bodies/material removed: no     Debridement:  None   Undermining:  None   Scar revision: no   Skin repair:    Repair method:  Sutures   Suture size:  4-0   Suture material:  Prolene   Number of sutures:  1 Approximation:    Approximation:  Close Repair type:    Repair type:  Simple Post-procedure details:    Dressing:  Non-adherent dressing   Procedure completion:  Tolerated well, no immediate complications .Laceration Repair  Date/Time: 05/18/2023 3:26 PM  Performed by: Peter Garter, PA Authorized by: Peter Garter, PA   Consent:    Consent obtained:  Verbal   Consent given by:  Patient   Risks, benefits, and alternatives were discussed: yes     Risks discussed:  Infection, need for additional repair and nerve damage   Alternatives discussed:  No treatment, delayed treatment, observation and referral Universal protocol:    Procedure explained and questions answered to patient or proxy's satisfaction: yes     Patient identity confirmed:  Verbally with patient Anesthesia:    Anesthesia method:  Local infiltration   Local anesthetic:  Lidocaine 2% WITH epi Laceration details:    Location:  Finger   Finger location:  R thumb   Length (cm):  1 Pre-procedure details:    Preparation:  Imaging obtained to evaluate for foreign bodies Exploration:    Limited defect created  (wound extended):  no     Hemostasis achieved with:  Direct pressure   Imaging obtained: x-ray     Imaging outcome: foreign body not noted     Wound exploration: wound explored through full range of motion and entire depth of wound visualized     Contaminated: no   Treatment:    Area cleansed with:  Chlorhexidine and saline   Amount of cleaning:  Standard   Irrigation solution:  Sterile saline   Irrigation volume:  150cc   Irrigation method:  Syringe   Visualized foreign bodies/material removed: no     Debridement:  None   Undermining:  None   Scar revision: no   Skin repair:    Repair method:  Sutures   Suture size:  4-0   Suture material:  Prolene   Number of sutures:  1 Approximation:    Approximation:  Close Repair type:    Repair type:  Simple Post-procedure details:    Dressing:  Non-adherent dressing   Procedure completion:  Tolerated well, no immediate complications     Medications Ordered in ED Medications  lidocaine-EPINEPHrine (XYLOCAINE W/EPI) 2 %-1:200000 (PF) injection 20 mL (has no administration in time range)  Tdap (BOOSTRIX) injection 0.5 mL (0.5 mLs Intramuscular Given 05/18/23 1256)    ED Course/ Medical Decision Making/ A&P                                 Medical Decision Making Amount and/or Complexity of Data Reviewed Radiology: ordered.  Risk Prescription drug management.   This patient presents to the ED for concern of laceration, this involves an extensive number of treatment options, and is a complaint that carries with it a high risk of complications and morbidity.  The differential diagnosis includes fracture, dislocation, strain/pain, foreign body retainment, ligamentous/tendinous injury, neurovascular compromise, other   Co morbidities that complicate the patient evaluation  See HPI   Additional history obtained:  Additional history obtained from EMR External records from outside source obtained and reviewed including hospital  records   Lab Tests:  N/a   Imaging Studies ordered:  I ordered imaging studies including dg right hand  I independently visualized and interpreted imaging which showed no fracture or dislocation.  1 mm density just lateral to radial to the proximal metaphysis distal phalanx of thumb. I agree with the radiologist interpretation  Cardiac Monitoring: / EKG:  The patient was maintained on a cardiac monitor.  I personally viewed and interpreted the cardiac monitored which showed an underlying rhythm of: Sinus rhythm   Consultations Obtained:  N/a   Problem List / ED Course / Critical interventions / Medication management  Lacerations I ordered medication including lidocaine with epinephrine, Tdap  Reevaluation of the patient after these medicines showed that the patient improved I have reviewed the patients home medicines and have made adjustments as needed   Social Determinants of Health:  Chronic cigarette use.  Denies illicit drug use.   Test / Admission - Considered:  Lacerations Vitals signs significant for hypertension blood pressure 160 systolic. Otherwise within normal range and stable throughout visit. Imaging studies significant for: See above 43 year old male presents emergency department with complaints of lacerations on right hand after he slipped in his garage and cut it on a piece of glass beside his fridge.  On exam, large U-shaped laceration with skin avulsion appreciated anterior lateral right thumb with 2 additional smaller lacerations above.  Lacerations did not  show any evidence of ligamentous/tendinous injury.  X-ray obtained which did show evidence of foreign body which was removed during irrigation process.  Lacerations repaired in manner as above.  Patient's Tdap updated while in the ED.  Given duration of time since lacerations occurred, will treat prophylactically with antibiotics.  Will recommend local wound care at home and follow-up for suture  removal in 7-10 days.  Treatment plan discussed at length with patient and he acknowledged understanding was agreeable to said plan.  Patient overall well-appearing, afebrile in no acute distress. Worrisome signs and symptoms were discussed with the patient, and the patient acknowledged understanding to return to the ED if noticed. Patient was stable upon discharge.          Final Clinical Impression(s) / ED Diagnoses Final diagnoses:  Laceration of right hand without foreign body, initial encounter    Rx / DC Orders ED Discharge Orders          Ordered    cefadroxil (DURICEF) 500 MG capsule  2 times daily,   Status:  Discontinued        05/18/23 1359    ibuprofen (ADVIL) 600 MG tablet  Every 6 hours PRN,   Status:  Discontinued        05/18/23 1359    cefadroxil (DURICEF) 500 MG capsule  2 times daily        05/18/23 1422    ibuprofen (ADVIL) 600 MG tablet  Every 6 hours PRN        05/18/23 1422              Peter Garter, [place] 05/18/23 1748    Franne Forts, DO 05/23/23 [id number]

## 2023-10-15 NOTE — Progress Notes (Signed)
  Electrophysiology Office Note:   Date:  10/16/2023  ID:  David Swanson, DOB Dec 10, 1980, MRN [id number]  Primary Cardiologist: None Primary Heart Failure: None Electrophysiologist: David Mofield Gladis Norton, MD      History of Present Illness:   David Swanson is a 43 y.o. male with h/o atrial fibrillation seen today for routine electrophysiology followup.   Since last being seen in our clinic the patient reports doing well.  He was initially having palpitations and about a Kardia mobile.  His monitor showed no episodes of atrial fibrillation or arrhythmia.  He has not had palpitations in many months.  He continues to feel like he is stopping breathing when he sleeps and waking up.  He would like a sleep study.  he denies chest pain, palpitations, dyspnea, PND, orthopnea, nausea, vomiting, dizziness, syncope, edema, weight gain, or early satiety.   Review of systems complete and found to be negative unless listed in HPI.   EP Information / Studies Reviewed:    EKG is ordered today. Personal review as below.  EKG Interpretation Date/Time:  Friday October 16 2023 09:50:42 EDT Ventricular Rate:  59 PR Interval:  128 QRS Duration:  100 QT Interval:  428 QTC Calculation: 423 R Axis:   41  Text Interpretation: Sinus bradycardia When compared with ECG of 17-Jul-2021 13:19, No significant change was found Confirmed by Jep Dyas ([id number]) on 10/16/2023 10:11:40 AM     Risk Assessment/Calculations:    CHA2DS2-VASc Score = 0   This indicates a 0.2% annual risk of stroke. The patient's score is based upon: CHF History: 0 HTN History: 0 Diabetes History: 0 Stroke History: 0 Vascular Disease History: 0 Age Score: 0 Gender Score: 0        STOP-Bang Score:          Physical Exam:   VS:  BP (!) 120/90 (BP Location: Left Arm, Patient Position: Sitting, Cuff Size: Large)   Pulse (!) 59   Ht 6' 2 (1.88 m)   Wt 199 lb (90.3 kg)   SpO2 97%   BMI 25.55 kg/m    Wt Readings from Last 3  Encounters:  10/16/23 199 lb (90.3 kg)  05/18/23 210 lb (95.3 kg)  05/29/22 203 lb (92.1 kg)     GEN: Well nourished, well developed in no acute distress NECK: No JVD; No carotid bruits CARDIAC: Regular rate and rhythm, no murmurs, rubs, gallops RESPIRATORY:  Clear to auscultation without rales, wheezing or rhonchi  ABDOMEN: Soft, non-tender, non-distended EXTREMITIES:  No edema; No deformity   ASSESSMENT AND PLAN:    1.  Paroxysmal atrial fibrillation: Post ablation 06/19/2021.  Has not had any further episodes since his ablation.  He is happy with his control.  David Swanson continue with current management.  2.  Snoring/possible apnea: David Swanson plan for sleep study  Follow up with Dr. Norton PRN   Signed, David Swanson Gladis Norton, MD

## 2023-10-16 ENCOUNTER — Encounter: Payer: Self-pay | Admitting: Cardiology

## 2023-10-16 ENCOUNTER — Ambulatory Visit: Attending: Cardiology | Admitting: Cardiology

## 2023-10-16 VITALS — BP 120/90 | HR 59 | Ht 74.0 in | Wt 199.0 lb

## 2023-10-16 DIAGNOSIS — I480 Paroxysmal atrial fibrillation: Secondary | ICD-10-CM | POA: Diagnosis not present

## 2023-10-16 DIAGNOSIS — R0683 Snoring: Secondary | ICD-10-CM | POA: Diagnosis not present

## 2023-10-16 NOTE — Patient Instructions (Addendum)
 Medication Instructions:  Your physician recommends that you continue on your current medications as directed. Please refer to the Current Medication list given to you today.  *If you need a refill on your cardiac medications before your next appointment, please call your pharmacy*  Lab Work: None ordered  If you have any lab test that is abnormal or we need to change your treatment, we will call you to review the results.  Testing/Procedures: Your physician has recommended that you have a sleep study. This test records several body functions during sleep, including: brain activity, eye movement, oxygen and carbon dioxide blood levels, heart rate and rhythm, breathing rate and rhythm, the flow of air through your mouth and nose, snoring, body muscle movements, and chest and belly movement.   Follow-Up: At [facility], you and your health needs are our priority.  As part of our continuing mission to provide you with exceptional heart care, our providers are all part of one team.  This team includes your primary Cardiologist (physician) and Advanced Practice Providers or APPs (Physician Assistants and Nurse Practitioners) who all work together to provide you with the care you need, when you need it.  Your next appointment:   As  needed  Provider:   Soyla Norton, MD     Thank you for choosing [facility]!!   Maeola Domino, RN [phone]

## 2023-11-16 ENCOUNTER — Telehealth: Payer: Self-pay | Admitting: Cardiology

## 2023-11-16 NOTE — Telephone Encounter (Signed)
 Pt dropped off a Cardiac Condition Check List form for Dr. Inocencio to fill out. Pt also stated he would like it faxed to his dr and he would like a call to come back and pick up a copy from us . Fax number is on paperwork.   Location: Dr. Inocencio mailbox

## 2023-11-19 NOTE — Telephone Encounter (Signed)
 PT came in this morning asking if it was ready I did not have it up in the box for pick up please call PT when ready to be picked up.

## 2023-11-24 NOTE — Telephone Encounter (Signed)
Attempted to reach pt.  Unable to leave message, no voicemail

## 2023-11-25 NOTE — Telephone Encounter (Signed)
 Pt made aware paper completed/signed.  Aware left up front for pick up. Patient verbalized understanding and agreeable to plan.

## 2023-12-25 ENCOUNTER — Telehealth: Payer: Self-pay

## 2023-12-25 NOTE — Telephone Encounter (Signed)
 Ordering provider: Dr. Inocencio  Associated diagnoses:  Paroxysmal atrial fibrillation (I48.0) Snoring (R06.83) WatchPAT PA obtained on 12/25/2023 by Dena JAYSON Hesselbach, CMA. Authorization: No prior authorization is required per [employer]. Patient notified of PIN (1234) on 12/25/2023 via detail voicemail  Phone note routed to covering staff for follow-up.

## 2024-01-14 NOTE — Telephone Encounter (Signed)
 Followed up with pt who reports:      - he has not received the WatchPat       - he never got any instructions on this.  I explained to pt that the sleep team sent him information via mychart (pt has not opened his mychart since March of this year).  Advised pt to try and access his mychart.   Aware I will forward this to sleep study team to follow up with pt about this.  Informed pt that they may need to restart this process and send him information again.

## 2024-01-15 NOTE — Telephone Encounter (Signed)
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 The Patient agreement is under the media tab on date 06/05/23.  The pt will need to return the device to the office.

## 2024-01-19 NOTE — Telephone Encounter (Signed)
 Pt said he is not sure what happened to the test, he even checked with his wife. (Is there anyway to avoid $100 fee?) He would like another study if possible and will pay the fee if necessary. Aware forwarding back to sleep study team for further input/advisement.

## 2024-01-21 NOTE — Telephone Encounter (Signed)
**Note De-Identified Danyl Deems Obfuscation** If the pt lost the device he will be charged $100 for it and it is ok for him to have another device once he pays for the last one.

## 2024-03-17 ENCOUNTER — Encounter: Payer: Self-pay | Admitting: *Deleted
